# Patient Record
Sex: Female | Born: 1945 | Race: Black or African American | Hispanic: No | Smoking: Never smoker
Health system: Southern US, Community
[De-identification: ages and names within clinical notes are randomized; demographics above are authoritative.]

## PROBLEM LIST (undated history)

## (undated) DIAGNOSIS — I10 Essential (primary) hypertension: Secondary | ICD-10-CM

## (undated) DIAGNOSIS — I2699 Other pulmonary embolism without acute cor pulmonale: Secondary | ICD-10-CM

## (undated) DIAGNOSIS — R160 Hepatomegaly, not elsewhere classified: Secondary | ICD-10-CM

## (undated) DIAGNOSIS — I639 Cerebral infarction, unspecified: Secondary | ICD-10-CM

## (undated) HISTORY — PX: ABDOMINAL HYSTERECTOMY: SHX81

---

## 2005-05-15 ENCOUNTER — Emergency Department (HOSPITAL_COMMUNITY): Admission: EM | Admit: 2005-05-15 | Discharge: 2005-05-15 | Payer: Self-pay | Admitting: Emergency Medicine

## 2016-03-08 ENCOUNTER — Emergency Department (INDEPENDENT_AMBULATORY_CARE_PROVIDER_SITE_OTHER)
Admission: EM | Admit: 2016-03-08 | Discharge: 2016-03-08 | Disposition: A | Discharge status: Home / Self Care | Payer: Medicare Other | Source: Home / Self Care | Attending: Family Medicine | Admitting: Family Medicine

## 2016-03-08 ENCOUNTER — Encounter (HOSPITAL_COMMUNITY): Payer: Self-pay | Admitting: Emergency Medicine

## 2016-03-08 DIAGNOSIS — J40 Bronchitis, not specified as acute or chronic: Secondary | ICD-10-CM | POA: Diagnosis not present

## 2016-03-08 MED ORDER — DOXYCYCLINE HYCLATE 100 MG PO CAPS: 100.0000 mg | ORAL_CAPSULE | Freq: Two times a day (BID) | ORAL | Status: DC

## 2016-03-08 MED ORDER — BENZONATATE 100 MG PO CAPS: 100.0000 mg | ORAL_CAPSULE | Freq: Three times a day (TID) | ORAL | Status: DC

## 2016-03-08 NOTE — ED Provider Notes (Signed)
CSN: [id number]     Arrival date & time 03/08/16  1410 History   First MD Initiated Contact with Patient 03/08/16 1532     Chief Complaint  Patient presents with  . URI   (Consider location/radiation/quality/duration/timing/severity/associated sxs/prior Treatment) HPI [name] is a 70 year old African American female who presents with chief complaint of cough. On March 6 she developed a cough, fever, and "head congestion," for which she took Coricidin and juice. The fever broke. However, she has continued to have a wet cough, though she is unable to produce mucus, and coughing causes midsternal chest pain. The cough keeps her up at night. She reports rhinorrhea, sore throat, wheezing at night. She denies any further fever/chills and chest pain. She finished the Coricidin 3-4 days ago.  She has a past medical history of stroke (with residual balance issues and occasional trouble swallowing), MI, HTN, Hep C (treated with Harvoni) and cirrhosis.  She is accompanied by her daughter. They are hoping for a better cough medicine for her and also want to prevent pneumonia.  History reviewed. No pertinent past medical history. History reviewed. No pertinent past surgical history. No family history on file. Social History  Substance Use Topics  . Smoking status: Never Smoker   . Smokeless tobacco: None  . Alcohol Use: No   OB History    No data available     Review of Systems See HPI.  Allergies  Review of patient's allergies indicates no known allergies.  Home Medications   Prior to Admission medications   Medication Sig Start Date End Date Taking? Authorizing Provider  aspirin 81 MG tablet Take 81 mg by mouth daily.   Yes Historical Provider, MD  furosemide (LASIX) 40 MG tablet Take 40 mg by mouth.   Yes Historical Provider, MD  nadolol (CORGARD) 40 MG tablet Take 40 mg by mouth daily.   Yes Historical Provider, MD  sertraline (ZOLOFT) 100 MG tablet Take 100 mg by mouth daily.   Yes  Historical Provider, MD  spironolactone (ALDACTONE) 25 MG tablet Take 25 mg by mouth daily.   Yes Historical Provider, MD  benzonatate (TESSALON) 100 MG capsule Take 1 capsule (100 mg total) by mouth every 8 (eight) hours. 03/08/16   Konrad Felix, PA  doxycycline (VIBRAMYCIN) 100 MG capsule Take 1 capsule (100 mg total) by mouth 2 (two) times daily. 03/08/16   Konrad Felix, PA   Meds Ordered and Administered this Visit  Medications - No data to display  BP 152/86 mmHg  Pulse 66  Temp(Src) 98.6 F (37 C) (Oral)  Resp 16  SpO2 96% No data found.   Physical Exam General: Pleasant female, overweight, no acute distress, seated in wheelchair Eyes: PERRL, EOM's intact, cataracts bilaterally ENT: TMs exhibit scarring; nasal turbinates erythematous; pharynx mildly erythematous with no exudates Neck: No cervical lymphadenopathy CV: RRR, no murmurs/rubs/gallops appreciated Lungs: CTAB, no wheezes/rales/rhonchi Abd: Soft, nondistended, nontender, hypoactive BS Psych: Normal mood and affect  ED Course  Procedures (including critical care time)  Labs Review Labs Reviewed - No data to display  Imaging Review No results found.   Visual Acuity Review  Right Eye Distance:   Left Eye Distance:   Bilateral Distance:    Right Eye Near:   Left Eye Near:    Bilateral Near:       We discussed physical findings and no need for chest xray today; the patient and daughter agreed.  MDM   1. Bronchitis    Given her  age and co-morbidities, especially PMH stroke and trouble swallowing, we will give a course of doxycycline to prevent development to pneumonia. Will give Tessalon perles for her cough to help her sleep at night.    Konrad Felix, [place] 03/08/16 1857

## 2016-03-08 NOTE — Discharge Instructions (Signed)
Upper Respiratory Infection, Adult Most upper respiratory infections (URIs) are a viral infection of the air passages leading to the lungs. A URI affects the nose, throat, and upper air passages. The most common type of URI is nasopharyngitis and is typically referred to as "the common cold." URIs run their course and usually go away on their own. Most of the time, a URI does not require medical attention, but sometimes a bacterial infection in the upper airways can follow a viral infection. This is called a secondary infection. Sinus and middle ear infections are common types of secondary upper respiratory infections. Bacterial pneumonia can also complicate a URI. A URI can worsen asthma and chronic obstructive pulmonary disease (COPD). Sometimes, these complications can require emergency medical care and may be life threatening.  CAUSES Almost all URIs are caused by viruses. A virus is a type of germ and can spread from one person to another.  RISKS FACTORS You may be at risk for a URI if:   You smoke.   You have chronic heart or lung disease.  You have a weakened defense (immune) system.   You are very young or very old.   You have nasal allergies or asthma.  You work in crowded or poorly ventilated areas.  You work in health care facilities or schools. SIGNS AND SYMPTOMS  Symptoms typically develop 2-3 days after you come in contact with a cold virus. Most viral URIs last 7-10 days. However, viral URIs from the influenza virus (flu virus) can last 14-18 days and are typically more severe. Symptoms may include:   Runny or stuffy (congested) nose.   Sneezing.   Cough.   Sore throat.   Headache.   Fatigue.   Fever.   Loss of appetite.   Pain in your forehead, behind your eyes, and over your cheekbones (sinus pain).  Muscle aches.  DIAGNOSIS  Your health care provider may diagnose a URI by:  Physical exam.  Tests to check that your symptoms are not due to  another condition such as:  Strep throat.  Sinusitis.  Pneumonia.  Asthma. TREATMENT  A URI goes away on its own with time. It cannot be cured with medicines, but medicines may be prescribed or recommended to relieve symptoms. Medicines may help:  Reduce your fever.  Reduce your cough.  Relieve nasal congestion. HOME CARE INSTRUCTIONS   Take medicines only as directed by your health care provider.   Gargle warm saltwater or take cough drops to comfort your throat as directed by your health care provider.  Use a warm mist humidifier or inhale steam from a shower to increase air moisture. This may make it easier to breathe.  Drink enough fluid to keep your urine clear or pale yellow.   Eat soups and other clear broths and maintain good nutrition.   Rest as needed.   Return to work when your temperature has returned to normal or as your health care provider advises. You may need to stay home longer to avoid infecting others. You can also use a face mask and careful hand washing to prevent spread of the virus.  Increase the usage of your inhaler if you have asthma.   Do not use any tobacco products, including cigarettes, chewing tobacco, or electronic cigarettes. If you need help quitting, ask your health care provider. PREVENTION  The best way to protect yourself from getting a cold is to practice good hygiene.   Avoid oral or hand contact with people with cold  symptoms.   Wash your hands often if contact occurs.  There is no clear evidence that vitamin C, vitamin E, echinacea, or exercise reduces the chance of developing a cold. However, it is always recommended to get plenty of rest, exercise, and practice good nutrition.  SEEK MEDICAL CARE IF:   You are getting worse rather than better.   Your symptoms are not controlled by medicine.   You have chills.  You have worsening shortness of breath.  You have brown or red mucus.  You have yellow or brown nasal  discharge.  You have pain in your face, especially when you bend forward.  You have a fever.  You have swollen neck glands.  You have pain while swallowing.  You have white areas in the back of your throat. SEEK IMMEDIATE MEDICAL CARE IF:   You have severe or persistent:  Headache.  Ear pain.  Sinus pain.  Chest pain.  You have chronic lung disease and any of the following:  Wheezing.  Prolonged cough.  Coughing up blood.  A change in your usual mucus.  You have a stiff neck.  You have changes in your:  Vision.  Hearing.  Thinking.  Mood. MAKE SURE YOU:   Understand these instructions.  Will watch your condition.  Will get help right away if you are not doing well or get worse.   This information is not intended to replace advice given to you by your health care provider. Make sure you discuss any questions you have with your health care provider.   Document Released: 06/04/2001 Document Revised: 04/25/2015 Document Reviewed: 03/16/2014 Elsevier Interactive Patient Education 2016 Else[employer] Acute Bronchitis Bronchitis is inflammation of the airways that extend from the windpipe into the lungs (bronchi). The inflammation often causes mucus to develop. This leads to a cough, which is the most common symptom of bronchitis.  In acute bronchitis, the condition usually develops suddenly and goes away over time, usually in a couple weeks. Smoking, allergies, and asthma can make bronchitis worse. Repeated episodes of bronchitis may cause further lung problems.  CAUSES Acute bronchitis is most often caused by the same virus that causes a cold. The virus can spread from person to person (contagious) through coughing, sneezing, and touching contaminated objects. SIGNS AND SYMPTOMS   Cough.   Fever.   Coughing up mucus.   Body aches.   Chest congestion.   Chills.   Shortness of breath.   Sore throat.  DIAGNOSIS  Acute bronchitis is  usually diagnosed through a physical exam. Your health care provider will also ask you questions about your medical history. Tests, such as chest X-rays, are sometimes done to rule out other conditions.  TREATMENT  Acute bronchitis usually goes away in a couple weeks. Oftentimes, no medical treatment is necessary. Medicines are sometimes given for relief of fever or cough. Antibiotic medicines are usually not needed but may be prescribed in certain situations. In some cases, an inhaler may be recommended to help reduce shortness of breath and control the cough. A cool mist vaporizer may also be used to help thin bronchial secretions and make it easier to clear the chest.  HOME CARE INSTRUCTIONS  Get plenty of rest.   Drink enough fluids to keep your urine clear or pale yellow (unless you have a medical condition that requires fluid restriction). Increasing fluids may help thin your respiratory secretions (sputum) and reduce chest congestion, and it will prevent dehydration.   Take medicines only as directed by  your health care provider.  If you were prescribed an antibiotic medicine, finish it all even if you start to feel better.  Avoid smoking and secondhand smoke. Exposure to cigarette smoke or irritating chemicals will make bronchitis worse. If you are a smoker, consider using nicotine gum or skin patches to help control withdrawal symptoms. Quitting smoking will help your lungs heal faster.   Reduce the chances of another bout of acute bronchitis by washing your hands frequently, avoiding people with cold symptoms, and trying not to touch your hands to your mouth, nose, or eyes.   Keep all follow-up visits as directed by your health care provider.  SEEK MEDICAL CARE IF: Your symptoms do not improve after 1 week of treatment.  SEEK IMMEDIATE MEDICAL CARE IF:  You develop an increased fever or chills.   You have chest pain.   You have severe shortness of breath.  You have bloody  sputum.   You develop dehydration.  You faint or repeatedly feel like you are going to pass out.  You develop repeated vomiting.  You develop a severe headache. MAKE SURE YOU:   Understand these instructions.  Will watch your condition.  Will get help right away if you are not doing well or get worse.   This information is not intended to replace advice given to you by your health care provider. Make sure you discuss any questions you have with your health care provider.   Document Released: 01/16/2005 Document Revised: 12/30/2014 Document Reviewed: 06/01/2013 Elsevier Interactive Patient Education [employer].

## 2016-03-08 NOTE — ED Notes (Signed)
C/o persistent prod cough onset 3/6 associated w/runny nose, congestion, PND Denies fevers A&O X4... No acute distress.

## 2017-07-27 ENCOUNTER — Emergency Department (HOSPITAL_COMMUNITY)
Admission: EM | Admit: 2017-07-27 | Discharge: 2017-07-27 | Disposition: A | Discharge status: Home / Self Care | Payer: Medicare Other | Attending: Emergency Medicine | Admitting: Emergency Medicine

## 2017-07-27 ENCOUNTER — Encounter (HOSPITAL_COMMUNITY): Payer: Self-pay | Admitting: Emergency Medicine

## 2017-07-27 ENCOUNTER — Emergency Department (HOSPITAL_COMMUNITY): Payer: Medicare Other

## 2017-07-27 DIAGNOSIS — Z79899 Other long term (current) drug therapy: Secondary | ICD-10-CM | POA: Insufficient documentation

## 2017-07-27 DIAGNOSIS — M25551 Pain in right hip: Secondary | ICD-10-CM | POA: Diagnosis present

## 2017-07-27 DIAGNOSIS — Z7982 Long term (current) use of aspirin: Secondary | ICD-10-CM | POA: Diagnosis not present

## 2017-07-27 LAB — I-STAT CHEM 8, ED
BUN: 21 mg/dL — AB (ref 6–20)
CHLORIDE: 106 mmol/L (ref 101–111)
Calcium, Ion: 1.12 mmol/L — ABNORMAL LOW (ref 1.15–1.40)
Creatinine, Ser: 0.9 mg/dL (ref 0.44–1.00)
Glucose, Bld: 96 mg/dL (ref 65–99)
HEMATOCRIT: 38 % (ref 36.0–46.0)
Hemoglobin: 12.9 g/dL (ref 12.0–15.0)
Potassium: 3.5 mmol/L (ref 3.5–5.1)
SODIUM: 146 mmol/L — AB (ref 135–145)
TCO2: 29 mmol/L (ref 0–100)

## 2017-07-27 MED ORDER — CYCLOBENZAPRINE HCL 5 MG PO TABS: 5.0000 mg | ORAL_TABLET | Freq: Three times a day (TID) | ORAL | 0 refills | Status: DC | PRN

## 2017-07-27 MED ORDER — PREDNISONE 20 MG PO TABS: ORAL_TABLET | ORAL | 0 refills | Status: DC

## 2017-07-27 MED ORDER — NAPROXEN 250 MG PO TABS: ORAL_TABLET | ORAL | 0 refills | Status: DC

## 2017-07-27 MED ORDER — NAPROXEN 500 MG PO TABS
250.0000 mg | ORAL_TABLET | Freq: Once | ORAL | Status: AC
  Administered 2017-07-27: 250 mg via ORAL
  Filled 2017-07-27: qty 1

## 2017-07-27 MED ORDER — DEXAMETHASONE SODIUM PHOSPHATE 4 MG/ML IJ SOLN: 10.0000 mg | Freq: Once | INTRAMUSCULAR | Status: DC

## 2017-07-27 MED ORDER — METHOCARBAMOL 500 MG PO TABS
1000.0000 mg | ORAL_TABLET | Freq: Three times a day (TID) | ORAL | Status: DC
  Administered 2017-07-27: 1000 mg via ORAL
  Filled 2017-07-27: qty 2

## 2017-07-27 NOTE — Discharge Instructions (Signed)
Use ice and heat for comfort. Take the medications as prescribed. Follow up with your doctor, or Dr Marcelino Scot, the orthopedist on call, if you aren't improving over the next week.

## 2017-07-27 NOTE — ED Triage Notes (Signed)
Brought in by EMS from home with c/o right hip pain, onset while she was getting into her car.  Pt denies trauma or injury---- denies fall or twisting leg.

## 2017-07-27 NOTE — ED Notes (Signed)
Bed: NZ83 Expected date:  Expected time:  Means of arrival:  Comments: HIP INJURY

## 2017-07-27 NOTE — ED Provider Notes (Signed)
[facility] DEPT Provider Note   CSN: [id number] Arrival date & time: 07/27/17  0140  Time seen 2:30 AM   History   Chief Complaint Chief Complaint  Patient presents with  . Hip Pain    HPI Audrey Weber is a 71 y.o. female.  HPI   patient states about 7 PM she had been riding in her daughter's car and when she started to get out of the car she had acute onset of pain in her right posterior hip. She denies any back pain. She denies any trauma or hearing a pop. She denies numbness in legs. He states she's never had this happen before. She states her family had to go get her walker so she did walk into the house. She states she is normally weak on her right side since she had a stroke 8 years ago. She has taken no medications for this pain tonight. She states movement makes the pain feel worse.  PCP [facility] hospital   History reviewed. No pertinent past medical history.  There are no active problems to display for this patient.   History reviewed. No pertinent surgical history.  OB History    No data available       Home Medications    Prior to Admission medications   Medication Sig Start Date End Date Taking? Authorizing Provider  aspirin EC 81 MG tablet Take 81 mg by mouth daily.   Yes [provider]  furosemide (LASIX) 40 MG tablet Take 40 mg by mouth daily.    Yes [provider]  magnesium oxide (MAGNESIUM-OXIDE) 400 (241.3 Mg) MG tablet Take 400 mg by mouth daily.   Yes [provider]  nadolol (CORGARD) 40 MG tablet Take 40 mg by mouth daily.   Yes [provider]  sertraline (ZOLOFT) 25 MG tablet Take 25 mg by mouth daily.   Yes [provider]  simvastatin (ZOCOR) 20 MG tablet Take 20 mg by mouth at bedtime.   Yes [provider]  spironolactone (ALDACTONE) 25 MG tablet Take 25 mg by mouth daily.   Yes [provider]  cyclobenzaprine (FLEXERIL) 5 MG tablet Take 1 tablet (5 mg total) by mouth 3  (three) times daily as needed for muscle spasms. 07/27/17   Rolland Porter, MD  naproxen (NAPROSYN) 250 MG tablet Take 1 po BID with food prn pain 07/27/17   Rolland Porter, MD  predniSONE (DELTASONE) 20 MG tablet Take 3 po QD x 3d , then 2 po QD x 3d then 1 po QD x 3d 07/27/17   Rolland Porter, MD    Family History History reviewed. No pertinent family history.  Social History Social History  Substance Use Topics  . Smoking status: Never Smoker  . Smokeless tobacco: Never Used  . Alcohol use No  uses a walker   Allergies   Patient has no known allergies.   Review of Systems Review of Systems  All other systems reviewed and are negative.    Physical Exam Updated Vital Signs BP (!) 153/98 (BP Location: Left Arm)   Pulse 89   Temp 98.6 F (37 C) (Oral)   Resp 16   SpO2 99%   Vital signs normal except for hypertension   Physical Exam  Constitutional: She is oriented to person, place, and time. She appears well-developed and well-nourished.  Non-toxic appearance. She does not appear ill. No distress.  HENT:  Head: Normocephalic and atraumatic.  Right Ear: External ear normal.  Left Ear: External ear  normal.  Nose: Nose normal. No mucosal edema or rhinorrhea.  Mouth/Throat: Oropharynx is clear and moist and mucous membranes are normal. No dental abscesses or uvula swelling.  Eyes: Pupils are equal, round, and reactive to light. Conjunctivae and EOM are normal.  Neck: Normal range of motion and full passive range of motion without pain. Neck supple.  Cardiovascular: Normal rate, regular rhythm and normal heart sounds.  Exam reveals no gallop and no friction rub.   No murmur heard. Pulmonary/Chest: Effort normal and breath sounds normal. No respiratory distress. She has no wheezes. She has no rhonchi. She has no rales. She exhibits no tenderness and no crepitus.  Abdominal: Soft. Normal appearance and bowel sounds are normal. She exhibits no distension. There is no tenderness. There is no  rebound and no guarding.  Musculoskeletal: Normal range of motion. She exhibits no edema or tenderness.  Moves all extremities well. Nontender in her lumbar spine. Patient is tender along her posterior and lateral  greater trochanter in her right SI joint that reproduces her complaints of pain.  Neurological: She is alert and oriented to person, place, and time. She has normal strength. No cranial nerve deficit.  Skin: Skin is warm, dry and intact. No rash noted. No erythema. No pallor.  Psychiatric: She has a normal mood and affect. Her speech is normal and behavior is normal. Her mood appears not anxious.  Nursing note and vitals reviewed.    ED Treatments / Results  Labs (all labs ordered are listed, but only abnormal results are displayed) Results for orders placed or performed during the hospital encounter of 07/27/17  I-stat Chem 8, ED  Result Value Ref Range   Sodium 146 (H) 135 - 145 mmol/L   Potassium 3.5 3.5 - 5.1 mmol/L   Chloride 106 101 - 111 mmol/L   BUN 21 (H) 6 - 20 mg/dL   Creatinine, Ser 0.90 0.44 - 1.00 mg/dL   Glucose, Bld 96 65 - 99 mg/dL   Calcium, Ion 1.12 (L) 1.15 - 1.40 mmol/L   TCO2 29 0 - 100 mmol/L   Hemoglobin 12.9 12.0 - 15.0 g/dL   HCT 38.0 36.0 - 46.0 %   Laboratory interpretation all normal     EKG  EKG Interpretation None       Radiology Dg Lumbar Spine Complete  Result Date: 07/27/2017 CLINICAL DATA:  Acute onset of right hip pain, extending along the right sacroiliac joint. Initial encounter. EXAM: LUMBAR SPINE - COMPLETE 4+ VIEW COMPARISON:  None. FINDINGS: There is no evidence of fracture or subluxation. Vertebral bodies demonstrate normal height and alignment. Intervertebral disc spaces are preserved. The visualized neural foramina are grossly unremarkable in appearance. The visualized bowel gas pattern is unremarkable in appearance; air and stool are noted within the colon. The sacroiliac joints are within normal limits. Scattered  calcification is seen along the abdominal aorta. IMPRESSION: 1. No evidence of fracture or subluxation along the lumbar spine. 2. Scattered aortic atherosclerosis. Electronically Signed   By: Garald Balding M.D.   On: 07/27/2017 03:20   Dg Hip Unilat W Or Wo Pelvis 2-3 Views Right  Result Date: 07/27/2017 CLINICAL DATA:  Acute onset of right hip pain.  Initial encounter. EXAM: DG HIP (WITH OR WITHOUT PELVIS) 2-3V RIGHT COMPARISON:  None. FINDINGS: There is no evidence of fracture or dislocation. Both femoral heads are seated normally within their respective acetabula. The proximal right femur appears intact. No significant degenerative change is appreciated. The sacroiliac joints are unremarkable in  appearance. The visualized bowel gas pattern is grossly unremarkable in appearance. Scattered phleboliths are noted within the pelvis. IMPRESSION: No evidence of fracture or dislocation. Electronically Signed   By: Garald Balding M.D.   On: 07/27/2017 03:19    Procedures Procedures (including critical care time)  Medications Ordered in ED Medications  methocarbamol (ROBAXIN) tablet 1,000 mg (1,000 mg Oral Given 07/27/17 0301)  dexamethasone (DECADRON) injection 10 mg (not administered)  naproxen (NAPROSYN) tablet 250 mg (250 mg Oral Given 07/27/17 0300)     Initial Impression / Assessment and Plan / ED Course  I have reviewed the triage vital signs and the nursing notes.  Pertinent labs & imaging results that were available during my care of the patient were reviewed by me and considered in my medical decision making (see chart for details).    X-rays were obtained of her right hip and back. She was given naproxen and Robaxin. Patient appears to be over the greater trochanter or over the right SI joint. She has good range of motion of the hip so I think fracture is less likely. X-ray was done to look for underlying arthritis. With the radiation of her pain into her leg she had lumbar spine films  done   Recheck at 4:45 AM she states her pain has not improved. She was given Decadron for pain.  Final Clinical Impressions(s) / ED Diagnoses   Final diagnoses:  Right hip pain    New Prescriptions New Prescriptions   CYCLOBENZAPRINE (FLEXERIL) 5 MG TABLET    Take 1 tablet (5 mg total) by mouth 3 (three) times daily as needed for muscle spasms.   NAPROXEN (NAPROSYN) 250 MG TABLET    Take 1 po BID with food prn pain   PREDNISONE (DELTASONE) 20 MG TABLET    Take 3 po QD x 3d , then 2 po QD x 3d then 1 po QD x 3d    Plan discharge  Rolland Porter, MD, Barbette Or, MD 07/27/17 [phone]

## 2018-08-12 ENCOUNTER — Observation Stay (HOSPITAL_COMMUNITY)
Admission: EM | Admit: 2018-08-12 | Discharge: 2018-08-13 | Disposition: A | Discharge status: Home / Self Care | Payer: Medicare Other | Attending: Family Medicine | Admitting: Family Medicine

## 2018-08-12 ENCOUNTER — Encounter (HOSPITAL_COMMUNITY): Payer: Self-pay

## 2018-08-12 ENCOUNTER — Emergency Department (HOSPITAL_COMMUNITY): Payer: Medicare Other

## 2018-08-12 ENCOUNTER — Other Ambulatory Visit: Payer: Self-pay

## 2018-08-12 DIAGNOSIS — Z8673 Personal history of transient ischemic attack (TIA), and cerebral infarction without residual deficits: Secondary | ICD-10-CM | POA: Insufficient documentation

## 2018-08-12 DIAGNOSIS — D72829 Elevated white blood cell count, unspecified: Secondary | ICD-10-CM | POA: Insufficient documentation

## 2018-08-12 DIAGNOSIS — I252 Old myocardial infarction: Secondary | ICD-10-CM | POA: Diagnosis not present

## 2018-08-12 DIAGNOSIS — K7460 Unspecified cirrhosis of liver: Secondary | ICD-10-CM | POA: Insufficient documentation

## 2018-08-12 DIAGNOSIS — F319 Bipolar disorder, unspecified: Secondary | ICD-10-CM | POA: Diagnosis not present

## 2018-08-12 DIAGNOSIS — R079 Chest pain, unspecified: Principal | ICD-10-CM | POA: Diagnosis present

## 2018-08-12 DIAGNOSIS — E785 Hyperlipidemia, unspecified: Secondary | ICD-10-CM | POA: Diagnosis not present

## 2018-08-12 DIAGNOSIS — Z6841 Body Mass Index (BMI) 40.0 and over, adult: Secondary | ICD-10-CM | POA: Diagnosis not present

## 2018-08-12 DIAGNOSIS — I2510 Atherosclerotic heart disease of native coronary artery without angina pectoris: Secondary | ICD-10-CM | POA: Insufficient documentation

## 2018-08-12 DIAGNOSIS — Z7982 Long term (current) use of aspirin: Secondary | ICD-10-CM | POA: Insufficient documentation

## 2018-08-12 DIAGNOSIS — I739 Peripheral vascular disease, unspecified: Secondary | ICD-10-CM | POA: Insufficient documentation

## 2018-08-12 DIAGNOSIS — I10 Essential (primary) hypertension: Secondary | ICD-10-CM | POA: Diagnosis not present

## 2018-08-12 HISTORY — DX: Essential (primary) hypertension: I10

## 2018-08-12 LAB — BRAIN NATRIURETIC PEPTIDE: B NATRIURETIC PEPTIDE 5: 33.7 pg/mL (ref 0.0–100.0)

## 2018-08-12 LAB — CBC
HCT: 39.7 % (ref 36.0–46.0)
HEMATOCRIT: 40.7 % (ref 36.0–46.0)
HEMOGLOBIN: 12.6 g/dL (ref 12.0–15.0)
Hemoglobin: 12.7 g/dL (ref 12.0–15.0)
MCH: 28.9 pg (ref 26.0–34.0)
MCH: 29.5 pg (ref 26.0–34.0)
MCHC: 31.2 g/dL (ref 30.0–36.0)
MCHC: 31.7 g/dL (ref 30.0–36.0)
MCV: 92.5 fL (ref 78.0–100.0)
MCV: 93 fL (ref 78.0–100.0)
PLATELETS: 139 10*3/uL — AB (ref 150–400)
Platelets: 121 10*3/uL — ABNORMAL LOW (ref 150–400)
RBC: 4.4 MIL/uL (ref 3.87–5.11)
RBC: 4.27 MIL/uL (ref 3.87–5.11)
RDW: 14.6 % (ref 11.5–15.5)
RDW: 14.7 % (ref 11.5–15.5)
WBC: 10.7 10*3/uL — AB (ref 4.0–10.5)
WBC: 10 10*3/uL (ref 4.0–10.5)

## 2018-08-12 LAB — I-STAT TROPONIN, ED
TROPONIN I, POC: 0.01 ng/mL (ref 0.00–0.08)
TROPONIN I, POC: 0.01 ng/mL (ref 0.00–0.08)
Troponin i, poc: 0 ng/mL (ref 0.00–0.08)
Troponin i, poc: 0 ng/mL (ref 0.00–0.08)

## 2018-08-12 LAB — CREATININE, SERUM
CREATININE: 0.96 mg/dL (ref 0.44–1.00)
GFR calc Af Amer: >60 mL/min (ref >60)
GFR calc non Af Amer: 57 mL/min — ABNORMAL LOW (ref >60)

## 2018-08-12 LAB — BASIC METABOLIC PANEL
Anion gap: 10 (ref 5–15)
BUN: 12 mg/dL (ref 8–23)
CHLORIDE: 100 mmol/L (ref 98–111)
CO2: 31 mmol/L (ref 22–32)
Calcium: 9.2 mg/dL (ref 8.9–10.3)
Creatinine, Ser: 0.9 mg/dL (ref 0.44–1.00)
GFR calc non Af Amer: >60 mL/min (ref >60)
Glucose, Bld: 90 mg/dL (ref 70–99)
POTASSIUM: 4.1 mmol/L (ref 3.5–5.1)
SODIUM: 141 mmol/L (ref 135–145)

## 2018-08-12 MED ORDER — HEPARIN SODIUM (PORCINE) 5000 UNIT/ML IJ SOLN
5000.0000 [IU] | Freq: Three times a day (TID) | INTRAMUSCULAR | Status: DC
  Administered 2018-08-12 – 2018-08-13 (×2): 5000 [IU] via SUBCUTANEOUS
  Filled 2018-08-12 (×2): qty 1

## 2018-08-12 MED ORDER — ONDANSETRON HCL 4 MG/2ML IJ SOLN: 4.0000 mg | Freq: Four times a day (QID) | INTRAMUSCULAR | Status: DC | PRN

## 2018-08-12 MED ORDER — SERTRALINE HCL 25 MG PO TABS
25.0000 mg | ORAL_TABLET | Freq: Every day | ORAL | Status: DC
  Administered 2018-08-13: 25 mg via ORAL
  Filled 2018-08-12 (×2): qty 1

## 2018-08-12 MED ORDER — GI COCKTAIL ~~LOC~~
30.0000 mL | Freq: Once | ORAL | Status: AC
  Administered 2018-08-12: 30 mL via ORAL
  Filled 2018-08-12: qty 30

## 2018-08-12 MED ORDER — FUROSEMIDE 20 MG PO TABS
40.0000 mg | ORAL_TABLET | Freq: Every day | ORAL | Status: DC
  Administered 2018-08-13: 40 mg via ORAL
  Filled 2018-08-12: qty 2

## 2018-08-12 MED ORDER — NADOLOL 40 MG PO TABS
40.0000 mg | ORAL_TABLET | Freq: Every day | ORAL | Status: DC
  Administered 2018-08-13: 40 mg via ORAL
  Filled 2018-08-12 (×2): qty 1

## 2018-08-12 MED ORDER — ACETAMINOPHEN 325 MG PO TABS: 650.0000 mg | ORAL_TABLET | ORAL | Status: DC | PRN

## 2018-08-12 MED ORDER — ASPIRIN EC 81 MG PO TBEC
81.0000 mg | DELAYED_RELEASE_TABLET | Freq: Every day | ORAL | Status: DC
  Administered 2018-08-13: 81 mg via ORAL
  Filled 2018-08-12: qty 1

## 2018-08-12 MED ORDER — SIMVASTATIN 20 MG PO TABS
20.0000 mg | ORAL_TABLET | Freq: Every day | ORAL | Status: DC
  Filled 2018-08-12: qty 1

## 2018-08-12 MED ORDER — SPIRONOLACTONE 25 MG PO TABS
25.0000 mg | ORAL_TABLET | Freq: Every day | ORAL | Status: DC
  Administered 2018-08-13: 25 mg via ORAL
  Filled 2018-08-12 (×2): qty 1

## 2018-08-12 NOTE — ED Provider Notes (Signed)
[facility] Provider Note  CSN: [id number] Arrival date & time: 08/12/18  1508  History   Chief Complaint Chief Complaint  Patient presents with  . Chest Pain   HPI  Audrey Weber is a 72 y.o. female with a medical history of HTN, cirrhosis, PVD, MI (x2) and stroke who presented to the ED for chest pain x30 minutes. The discomfort is described as midsternal aching pain with no radiation. Onset of symptoms was abrupt starting at ~ 2pm after waking up from a nap. She states that chest pain stopped after 30 minutes. Denies any other symptoms during that time including dizziness, SOB, cough, abdominal pain, N/V, vision changes, slurred speech, paresthesias. She was given 324mg  of aspirin. Patient arrived to the ED without chest pain. She reports having similar episodes of chest pain in the past preceding her past MI.     Past Medical History:  Diagnosis Date  . Hypertension     Patient Active Problem List   Diagnosis Date Noted  . Chest pain 08/12/2018    Past Surgical History:  Procedure Laterality Date  . ABDOMINAL HYSTERECTOMY       OB History   None      Home Medications    Prior to Admission medications   Medication Sig Start Date End Date Taking? Authorizing Provider  aspirin EC 81 MG tablet Take 81 mg by mouth daily.    [provider]  cyclobenzaprine (FLEXERIL) 5 MG tablet Take 1 tablet (5 mg total) by mouth 3 (three) times daily as needed for muscle spasms. 07/27/17   Rolland Porter, MD  furosemide (LASIX) 40 MG tablet Take 40 mg by mouth daily.     [provider]  magnesium oxide (MAGNESIUM-OXIDE) 400 (241.3 Mg) MG tablet Take 400 mg by mouth daily.    [provider]  nadolol (CORGARD) 40 MG tablet Take 40 mg by mouth daily.    [provider]  naproxen (NAPROSYN) 250 MG tablet Take 1 po BID with food prn pain 07/27/17   Rolland Porter, MD  predniSONE (DELTASONE) 20 MG tablet Take 3 po QD x 3d ,  then 2 po QD x 3d then 1 po QD x 3d 07/27/17   Rolland Porter, MD  sertraline (ZOLOFT) 25 MG tablet Take 25 mg by mouth daily.    [provider]  simvastatin (ZOCOR) 20 MG tablet Take 20 mg by mouth at bedtime.    [provider]  spironolactone (ALDACTONE) 25 MG tablet Take 25 mg by mouth daily.    [provider]    Family History No family history on file.  Social History Social History   Tobacco Use  . Smoking status: Never Smoker  . Smokeless tobacco: Never Used  Substance Use Topics  . Alcohol use: No  . Drug use: No     Allergies   Patient has no known allergies.   Review of Systems Review of Systems  Constitutional: Negative for chills, fatigue and fever.  Eyes: Negative for visual disturbance.  Respiratory: Negative for cough, chest tightness and shortness of breath.   Cardiovascular: Positive for chest pain. Negative for palpitations and leg swelling.  Gastrointestinal: Negative for abdominal pain, nausea and vomiting.  Genitourinary: Negative.   Musculoskeletal: Negative.   Skin: Negative.   Neurological: Negative.   Psychiatric/Behavioral: Negative.    Physical Exam Updated Vital Signs BP 134/72   Pulse 70   Temp 98.3 F (36.8 C) (Oral)   Resp 19  Ht 5' (1.524 m)   Wt 104.3 kg   SpO2 98%   BMI 44.92 kg/m   Physical Exam  Eyes: Pupils are equal, round, and reactive to light. EOM are normal.  Neck: Normal range of motion. Neck supple.  Cardiovascular: Normal rate, regular rhythm, intact distal pulses and normal pulses. PMI is not displaced.  No murmur heard. Pulmonary/Chest: Effort normal and breath sounds normal.  Not in respiratory distress. Rhonchi heard in lower lobes bilaterally. Normal breath sounds throughout remaining lung fields.   Abdominal: Soft. Bowel sounds are normal. She exhibits no distension.  Tenderness in LUQ near scar from previous abdominal surgery.  Musculoskeletal:       Right lower leg: Normal. She  exhibits no edema.       Left lower leg: Normal. She exhibits no edema.  Skin: Skin is warm. Capillary refill takes less than 2 seconds. She is not diaphoretic. No pallor.  Psychiatric: She has a normal mood and affect. Her behavior is normal.  Nursing note and vitals reviewed.  ED Treatments / Results  Labs (all labs ordered are listed, but only abnormal results are displayed) Labs Reviewed  CBC - Abnormal; Notable for the following components:      Result Value   WBC 10.7 (*)    Platelets 139 (*)    All other components within normal limits  BASIC METABOLIC PANEL  BRAIN NATRIURETIC PEPTIDE  CBC  CREATININE, SERUM  I-STAT TROPONIN, ED  I-STAT TROPONIN, ED    EKG EKG Interpretation  Date/Time:  Wednesday August 12 2018 15:51:25 EDT Ventricular Rate:  69 PR Interval:    QRS Duration: 105 QT Interval:  428 QTC Calculation: 459 R Axis:   -17 Text Interpretation:  Sinus rhythm Borderline prolonged PR interval Low voltage, precordial leads Probable left ventricular hypertrophy Borderline T abnormalities, inferior leads Confirmed by Milton Ferguson [phone]) on 08/12/2018 4:37:36 PM   Radiology Dg Chest 2 View  Result Date: 08/12/2018 CLINICAL DATA:  Chest pain a few hours ago. No pain is noted at this time. EXAM: CHEST - 2 VIEW COMPARISON:  None. FINDINGS: The heart size and mediastinal contours are within normal limits. Both lungs are clear. The visualized skeletal structures are unremarkable. Mediastinum is mildly prominent secondary to patient rotation to the right. IMPRESSION: No active cardiopulmonary disease. Electronically Signed   By: Inez Catalina M.D.   On: 08/12/2018 17:00    Procedures Procedures (including critical care time)  Medications Ordered in ED Medications  aspirin EC tablet 81 mg (has no administration in time range)  furosemide (LASIX) tablet 40 mg (has no administration in time range)  nadolol (CORGARD) tablet 40 mg (has no administration in time range)    simvastatin (ZOCOR) tablet 20 mg (has no administration in time range)  spironolactone (ALDACTONE) tablet 25 mg (has no administration in time range)  sertraline (ZOLOFT) tablet 25 mg (has no administration in time range)  acetaminophen (TYLENOL) tablet 650 mg (has no administration in time range)  ondansetron (ZOFRAN) injection 4 mg (has no administration in time range)  heparin injection 5,000 Units (has no administration in time range)  gi cocktail (Maalox,Lidocaine,Donnatal) (30 mLs Oral Given 08/12/18 1713)     Initial Impression / Assessment and Plan / ED Course  Triage vital signs and the nursing notes have been reviewed.  Pertinent labs & imaging results that were available during care of the patient were reviewed and considered in medical decision making (see chart for details).  Patient presents mildly hypertensive,  but currently does not have any chest pain or other physical complaints. She describes a 30 minute episode of midsternal chest pain with no accompanying symptoms that was relieved with rest and 324mg  of aspirin. No other medications ordered in the ED as patient is currently asymptomatic. HEART score of 5 which places her at increased risk of acute cardiac event. Patient has history of prior myocardial infarcts (~1980s) as seen in [facility] and endorsed by patient's family. Will proceed with appropriate chest pain work-up.  Clinical Course as of Aug 12 1746  Wed Aug 12, 2018  1542 Negative troponin which is reassuring.   [GM]  1600 EKG showed NSR. No ST elevations/depressions or signs of acute ischemia or infarct. Borderline prolonged PR interval. No past EKGs to compare to. Overall reassuring EKG especially in conjunction with negative troponin. Will order repeat troponin.   [GM]  1645 Case discussed with Dr. Milton Ferguson. Given pt's history of past MI and stroke, patient would benefit from at least overnight observation and chest pain work-up. Consult placed for  observation.   [GM]  1707 Case discussed with [facility] who also spoke with Dr. Milton Ferguson. [facility] will see the patient. Per [facility] request, GI cocktail was ordered despite patient not having any chest pain or discomfort.   [GM]  5701 CXR normal. No vascular congestion, consolidation or intra/extra-pleural abnormalities.   [GM]    Clinical Course User Index [GM] Colon Rueth, Jonelle Sports, PA-C    Final Clinical Impressions(s) / ED Diagnoses  1. Chest Pain. Case discussed with [facility] for observation.   Dispo: Observation.  Final diagnoses:  Chest pain, unspecified type    ED Discharge Orders    None        Junita Push 08/12/18 1747    Milton Ferguson, MD 08/12/18 2329

## 2018-08-12 NOTE — ED Notes (Signed)
Spoke to pharmacy tech to verify medications.

## 2018-08-12 NOTE — H&P (Signed)
History and Physical  Audrey Weber [id number] DOB: [birth year] DOA: 08/12/2018  PCP: Patient, No Pcp Per   Chief Complaint: CP  HPI:  89 yr African-American female, BMI 33, prior stroke 2007, reported history of MI but no documentation in care everywhere of ever having MIs-daughter states that this happened in 75?? 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, multiple other medical conditions comes into the hospital with chest pain  Was asleep this afternoon awoke with a start with central abdominal pain that seem to radiate upwards-no nausea, no vomiting, no fever, no chills, no DOE At baseline is poorly functional can only walk 15-20 steps and then gets winded, she also is not able to really participate much in past medical history and does not remember many details Has been living with her daughter after moving from [place] 3 years ago  To the ED was given 325 ASA with some relief of the pain  ED Course: In the emergency room was chest pain-free-has not yet received GI cocktail that I requested  Review of Systems:   Negative for fever, visual changes, sore throat, rash, new muscle aches, chest pain, SOB, dysuria, bleeding, n/v/abdominal pain.  Past Medical History:  Diagnosis Date  . Hypertension     Past Surgical History:  Procedure Laterality Date  . ABDOMINAL HYSTERECTOMY       reports that she has never smoked. She has never used smokeless tobacco. She reports that she does not drink alcohol or use drugs. Mobility: Without assistive device prior smoker   No Known Allergies  No family history on file. Extensive CAD CVAs in the past  Prior to Admission medications   Medication Sig Start Date End Date Taking? Authorizing Provider  aspirin EC 81 MG tablet Take 81 mg by mouth daily.    [provider]  cyclobenzaprine (FLEXERIL) 5 MG tablet Take 1 tablet (5 mg total) by mouth 3 (three) times daily as needed for muscle spasms. 07/27/17    Rolland Porter, MD  furosemide (LASIX) 40 MG tablet Take 40 mg by mouth daily.     [provider]  magnesium oxide (MAGNESIUM-OXIDE) 400 (241.3 Mg) MG tablet Take 400 mg by mouth daily.    [provider]  nadolol (CORGARD) 40 MG tablet Take 40 mg by mouth daily.    [provider]  naproxen (NAPROSYN) 250 MG tablet Take 1 po BID with food prn pain 07/27/17   Rolland Porter, MD  predniSONE (DELTASONE) 20 MG tablet Take 3 po QD x 3d , then 2 po QD x 3d then 1 po QD x 3d 07/27/17   Rolland Porter, MD  sertraline (ZOLOFT) 25 MG tablet Take 25 mg by mouth daily.    [provider]  simvastatin (ZOCOR) 20 MG tablet Take 20 mg by mouth at bedtime.    [provider]  spironolactone (ALDACTONE) 25 MG tablet Take 25 mg by mouth daily.    [provider]    Physical Exam:   Orbitally obese prior cataract surgeries noted no pallor no icterus Mallampati 4 never tested for OSA apparently  Mild JVD with mild hepatojugular reflex no carotids  Chest is clinically clear no added sound S1-S2 displaced PMI no murmur  Massively obese abdomen making examination difficult she has tenderness directly over the epigastrium and asked me to stop pressing on it no lower extremity edema  No CVA tenderness  Skin has no breakdown  Neurologically intact able to flex extend at arm and legs  and knees without deficit sensory grossly intact  I have personally reviewed following labs and imaging studies  Labs:   P is 33 troponin is 0.00 chest x-ray is negative white count is 10.7 platelets are 130s 9  Medical tests:   EKG independently reviewed: Sinus rhythm PR interval is 0.12 QRS axis is -20 appears to have an incomplete right bundle there is no prior EKG to compare does not seem to be an ischemic type pattern  Test discussed with performing physician:  n   Decision to obtain old records:   n   Review and summation of old records:   n   Active Problems:   * No  active hospital problems. *   Assessment/Plan Chest pain-cause unclear but probably could be related to epigastric tenderness and gastritis I will ask for GI cocktail to be done-because of the reported history of 2 MIs in the past it is reasonable to bring her in-she is already on a nonselective beta-blocker for her cirrhosis is not low which may need to be converted to a more selective beta-blocker such as metoprolol--at this time to think that this is MI and therefore we will hold on nitrates and heparin-we will follow troponins x3 and EKG in the morning and if negative she will need outpatient work-up--- that she is on Naprosyn which can sometimes cause worsening of gastritis  BMI >44-highest risk is her morbid obesity-she needs medical weight loss guidance  Hepatitis C status post Harvoni-no LFTs were done on admission we will repeat them-she can continue her Aldactone and Lasix at prior to admission doses she does not seem volume overloaded to me  Bipolar-continue Zoloft 25 daily  Mild leukocytosis-no overt fever-monitor in a.m.  Hyperlipidemia in a patient with chest pain we will get lipids and risk stratify      Severity of Illness: The appropriate patient status for this patient is OBSERVATION. Observation status is judged to be reasonable and necessary in order to provide the required intensity of service to ensure the patient's safety. The patient's presenting symptoms, physical exam findings, and initial radiographic and laboratory data in the context of their medical condition is felt to place them at decreased risk for further clinical deterioration. Furthermore, it is anticipated that the patient will be medically stable for discharge from the hospital within 2 midnights of admission. The following factors support the patient status of observation.   " The patient's presenting symptoms include chest pain. " The physical exam findings include epigastric tenderness and  discomfort. " The initial radiographic and laboratory data are reassuring and I think she can be discharged in the morning if work-up is negative.      Lovenox DNR observation discussed with daughter  Time spent: 9 minutes  Verneita Griffes, MD [facility] 5:31 PM   08/12/2018, 5:31 PM

## 2018-08-12 NOTE — ED Triage Notes (Signed)
Per [facility], pt coming from home where she experienced 30 min of CP from 1400 to 1430. Pt was pain free on EMS arrival. Pt received 324 ASA. Pt denies current pain, nausea, or SOB. EMS reports EKG unremarkable.

## 2018-08-13 DIAGNOSIS — R079 Chest pain, unspecified: Secondary | ICD-10-CM | POA: Diagnosis not present

## 2018-08-13 LAB — I-STAT TROPONIN, ED: TROPONIN I, POC: 0.01 ng/mL (ref 0.00–0.08)

## 2018-08-13 MED ORDER — ASPIRIN EC 81 MG PO TBEC: 81.0000 mg | DELAYED_RELEASE_TABLET | Freq: Every day | ORAL | 2 refills | Status: AC

## 2018-08-13 MED ORDER — PANTOPRAZOLE SODIUM 40 MG PO TBEC: 40.0000 mg | DELAYED_RELEASE_TABLET | Freq: Every day | ORAL | 11 refills | Status: DC

## 2018-08-13 NOTE — ED Notes (Signed)
Pt. Placed in hospital bed. Brief changed and purwick in place. Will continue to monitor.

## 2018-08-13 NOTE — Discharge Summary (Signed)
Physician Discharge Summary  Audrey Weber [id number] DOB: Nov 29, 1946 DOA: 08/12/2018  PCP: [facility] date: 08/12/2018 Discharge date: 08/13/2018  Time spent: 25 minutes  Recommendations for Outpatient Follow-up:  1. Suggest outpatient work-up for abdominal discomfort-did not have ACS this admission 2. Suggest outpatient stress testing as per PCP  Discharge Diagnoses:  Active Problems:   Chest pain   Discharge Condition: IMP  Diet recommendation: [facility]  Filed Weights   08/12/18 1511  Weight: 104.3 kg    History of present illness:  60 yr African-American female, BMI 69, prior stroke 2007, reported history of MI but no documentation in care everywhere of ever having MIs-daughter states that this happened in 63?? 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, multiple other medical conditions comes into the hospital with chest pain  Was asleep this afternoon awoke with a start with central abdominal pain that seem to radiate upwards-no nausea, no vomiting, no fever, no chills, no DOE At baseline is poorly functional can only walk 15-20 steps and then gets winded, she also is not able to really participate much in past medical history and does not remember many details Has been living with her daughter after moving from [place] 3 years ago  To the ED was given 325 ASA with some relief of the pain   Patient was admitted to the hospital given GI cocktail had no recurrence of her chest pain troponins were negative a.m. EKG my read showed left anterior fascicular block PR interval 0.12 with no ischemic injury and on pressure on her abdomen on discharge she still had some discomfort so I feel this is more gastrointestinal however I do feel that with her risks and reported issues with regards to prior MI which is unclear at this time as not documented she does not warrant an outpatient ACS work-up and by her primary     Discharge  Exam: Vitals:   08/13/18 1010 08/13/18 1011  BP: (!) 144/95 (!) 144/95  Pulse: 68 67  Resp:  16  Temp:    SpO2:  98%    General: Awake alert no distress tolerating diet No chest pain Eating drinking Wants to go home Cardiovascular: S1-S2 no murmur rub or gallop no reproducible pain in the chest Respiratory: Clinically clear no added sound Abdomen soft no rebound  Discharge Instructions   Discharge Instructions    Diet - low sodium heart healthy   Complete by:  As directed    Discharge instructions   Complete by:  As directed    You were found to have no significant evidence of heart attack on testing and may need an outpatient stress test however I think it is very reasonable that you go home and get this done-your signs and symptoms were more concerning for reflux and with the heart testing that we did overnight I feel it is reasonable that you take Protonix and get your primary care physician to schedule you for an outpatient stress test if you continue to have these issues, I would recommend you take aspirin in addition to your medications as this will be a good preventative against heart issues--take over-the-counter aspirin 81 mg   Increase activity slowly   Complete by:  As directed      Allergies as of 08/13/2018   No Known Allergies     Medication List    STOP taking these medications   UNKNOWN TO PATIENT     TAKE these medications   aspirin  EC 81 MG tablet Take 81 mg by mouth daily. What changed:  Another medication with the same name was added. Make sure you understand how and when to take each.   aspirin EC 81 MG tablet Take 1 tablet (81 mg total) by mouth daily. What changed:  You were already taking a medication with the same name, and this prescription was added. Make sure you understand how and when to take each.   furosemide 40 MG tablet Commonly known as:  LASIX Take 40 mg by mouth daily.   nadolol 40 MG tablet Commonly known as:  CORGARD Take  40 mg by mouth daily.   pantoprazole 40 MG tablet Commonly known as:  PROTONIX Take 1 tablet (40 mg total) by mouth daily.   sertraline 25 MG tablet Commonly known as:  ZOLOFT Take 25 mg by mouth daily.   simvastatin 40 MG tablet Commonly known as:  ZOCOR Take 40 mg by mouth at bedtime.   spironolactone 25 MG tablet Commonly known as:  ALDACTONE Take 25 mg by mouth daily.      No Known Allergies    The results of significant diagnostics from this hospitalization (including imaging, microbiology, ancillary and laboratory) are listed below for reference.    Significant Diagnostic Studies: Dg Chest 2 View  Result Date: 08/12/2018 CLINICAL DATA:  Chest pain a few hours ago. No pain is noted at this time. EXAM: CHEST - 2 VIEW COMPARISON:  None. FINDINGS: The heart size and mediastinal contours are within normal limits. Both lungs are clear. The visualized skeletal structures are unremarkable. Mediastinum is mildly prominent secondary to patient rotation to the right. IMPRESSION: No active cardiopulmonary disease. Electronically Signed   By: Inez Catalina M.D.   On: 08/12/2018 17:00    Microbiology: No results found for this or any previous visit (from the past 240 hour(s)).   Labs: Basic Metabolic Panel: Recent Labs  Lab 08/12/18 1514 08/12/18 1857  NA 141  --   K 4.1  --   CL 100  --   CO2 31  --   GLUCOSE 90  --   BUN 12  --   CREATININE 0.90 0.96  CALCIUM 9.2  --    Liver Function Tests: No results for input(s): AST, ALT, ALKPHOS, BILITOT, PROT, ALBUMIN in the last 168 hours. No results for input(s): LIPASE, AMYLASE in the last 168 hours. No results for input(s): AMMONIA in the last 168 hours. CBC: Recent Labs  Lab 08/12/18 1514 08/12/18 1857  WBC 10.7* 10.0  HGB 12.7 12.6  HCT 40.7 39.7  MCV 92.5 93.0  PLT 139* 121*   Cardiac Enzymes: No results for input(s): CKTOTAL, CKMB, CKMBINDEX, TROPONINI in the last 168 hours. BNP: BNP (last 3 results) Recent  Labs    08/12/18 1515  BNP 33.7    ProBNP (last 3 results) No results for input(s): PROBNP in the last 8760 hours.  CBG: No results for input(s): GLUCAP in the last 168 hours.     Signed:  Nita Sells MD   [facility] 08/13/2018, 10:21 AM

## 2018-08-13 NOTE — ED Notes (Signed)
Pt stable, ambulatory, states understanding of discharge instructions and prescription pickup

## 2020-03-21 ENCOUNTER — Emergency Department (HOSPITAL_COMMUNITY): Payer: Medicare Other

## 2020-03-21 ENCOUNTER — Emergency Department (HOSPITAL_COMMUNITY)
Admission: EM | Admit: 2020-03-21 | Discharge: 2020-03-21 | Disposition: A | Discharge status: Home / Self Care | Payer: Medicare Other | Attending: Emergency Medicine | Admitting: Emergency Medicine

## 2020-03-21 ENCOUNTER — Encounter (HOSPITAL_COMMUNITY): Payer: Self-pay | Admitting: Emergency Medicine

## 2020-03-21 ENCOUNTER — Other Ambulatory Visit: Payer: Self-pay

## 2020-03-21 DIAGNOSIS — W01198A Fall on same level from slipping, tripping and stumbling with subsequent striking against other object, initial encounter: Secondary | ICD-10-CM | POA: Insufficient documentation

## 2020-03-21 DIAGNOSIS — Z7901 Long term (current) use of anticoagulants: Secondary | ICD-10-CM | POA: Diagnosis not present

## 2020-03-21 DIAGNOSIS — Z79899 Other long term (current) drug therapy: Secondary | ICD-10-CM | POA: Insufficient documentation

## 2020-03-21 DIAGNOSIS — S0990XA Unspecified injury of head, initial encounter: Secondary | ICD-10-CM

## 2020-03-21 DIAGNOSIS — I10 Essential (primary) hypertension: Secondary | ICD-10-CM | POA: Insufficient documentation

## 2020-03-21 DIAGNOSIS — Y92019 Unspecified place in single-family (private) house as the place of occurrence of the external cause: Secondary | ICD-10-CM | POA: Diagnosis not present

## 2020-03-21 DIAGNOSIS — R519 Headache, unspecified: Secondary | ICD-10-CM | POA: Diagnosis present

## 2020-03-21 DIAGNOSIS — W19XXXA Unspecified fall, initial encounter: Secondary | ICD-10-CM

## 2020-03-21 DIAGNOSIS — Y939 Activity, unspecified: Secondary | ICD-10-CM | POA: Insufficient documentation

## 2020-03-21 DIAGNOSIS — Y999 Unspecified external cause status: Secondary | ICD-10-CM | POA: Diagnosis not present

## 2020-03-21 DIAGNOSIS — M545 Low back pain: Secondary | ICD-10-CM | POA: Diagnosis not present

## 2020-03-21 DIAGNOSIS — Z7982 Long term (current) use of aspirin: Secondary | ICD-10-CM | POA: Insufficient documentation

## 2020-03-21 DIAGNOSIS — Y92009 Unspecified place in unspecified non-institutional (private) residence as the place of occurrence of the external cause: Secondary | ICD-10-CM

## 2020-03-21 LAB — CBG MONITORING, ED: Glucose-Capillary: 75 mg/dL (ref 70–99)

## 2020-03-21 MED ORDER — ACETAMINOPHEN 325 MG PO TABS: 650.0000 mg | ORAL_TABLET | Freq: Once | ORAL | Status: DC

## 2020-03-21 NOTE — Discharge Instructions (Addendum)
Continue your home medications as previously prescribed. Return to the ER if you start to experience worsening headache, blurry vision, numbness in arms or legs, additional injuries or falls.

## 2020-03-21 NOTE — Progress Notes (Signed)
Orthopedic Tech Progress Note Patient Details:  Audrey Weber 1946/06/10 [id number]  Patient ID: Audrey Weber, female   DOB: 17-Jun-1946, 74 y.o.   MRN: [id number]   Audrey Weber 03/21/2020, 3:56 PMLEVEL 2 TRAUMA ALERT

## 2020-03-21 NOTE — ED Provider Notes (Signed)
[facility] EMERGENCY DEPARTMENT Provider Note   CSN: [id number] Arrival date & time: 03/21/20  1448     History Chief Complaint  Patient presents with  . Fall    Audrey Weber is a 74 y.o. female with a past medical history of hypertension, currently anticoagulated on Eliquis started 1 month ago presenting to the ED after fall that occurred prior to arrival.  Patient presents as a level 2 trauma.  She was cleaning her house, putting things away when she had a mechanical fall which caused her to fall forward, hit the top of her head on the windowsill.  She fell onto the ground.  She denies any loss of consciousness.  She was able to help get up with assistance of her daughter that lives with her.  She ambulates with a walker at baseline.  She has been taking her Eliquis every day as directed, her daughter helps her take her medications on time.  She reports lower back pain as well.  Reports remote history of prior back surgeries over 10 years ago.  She denies any neck pain, vision changes, vomiting, numbness in arms or legs, chest pain, abdominal pain.  HPI     Past Medical History:  Diagnosis Date  . Hypertension     Patient Active Problem List   Diagnosis Date Noted  . Chest pain 08/12/2018    Past Surgical History:  Procedure Laterality Date  . ABDOMINAL HYSTERECTOMY       OB History   No obstetric history on file.     No family history on file.  Social History   Tobacco Use  . Smoking status: Never Smoker  . Smokeless tobacco: Never Used  Substance Use Topics  . Alcohol use: No  . Drug use: No    Home Medications Prior to Admission medications   Medication Sig Start Date End Date Taking? Authorizing Provider  aspirin EC 81 MG tablet Take 81 mg by mouth daily.    [provider]  furosemide (LASIX) 40 MG tablet Take 40 mg by mouth daily.     [provider]  nadolol (CORGARD) 40 MG tablet Take 40 mg by mouth daily.     [provider]  pantoprazole (PROTONIX) 40 MG tablet Take 1 tablet (40 mg total) by mouth daily. 08/13/18 08/13/19  Nita Sells, MD  sertraline (ZOLOFT) 25 MG tablet Take 25 mg by mouth daily.    [provider]  simvastatin (ZOCOR) 40 MG tablet Take 40 mg by mouth at bedtime.    [provider]  spironolactone (ALDACTONE) 25 MG tablet Take 25 mg by mouth daily.    [provider]    Allergies    Patient has no known allergies.  Review of Systems   Review of Systems  Constitutional: Negative for appetite change, chills and fever.  HENT: Negative for ear pain, rhinorrhea, sneezing and sore throat.   Eyes: Negative for photophobia and visual disturbance.  Respiratory: Negative for cough, chest tightness, shortness of breath and wheezing.   Cardiovascular: Negative for chest pain and palpitations.  Gastrointestinal: Negative for abdominal pain, blood in stool, constipation, diarrhea, nausea and vomiting.  Genitourinary: Negative for dysuria, hematuria and urgency.  Musculoskeletal: Positive for back pain. Negative for myalgias.  Skin: Negative for rash.  Neurological: Positive for headaches. Negative for dizziness, weakness and light-headedness.    Physical Exam Updated Vital Signs BP 107/80   Pulse 71   Temp 97.8 F (36.6 C)  Resp 16   Ht 5' (1.524 m)   Wt 101.6 kg   SpO2 97%   BMI 43.75 kg/m   Physical Exam Vitals and nursing note reviewed.  Constitutional:      General: She is not in acute distress.    Appearance: She is well-developed.  HENT:     Head: Normocephalic and atraumatic.     Nose: Nose normal.  Eyes:     General: No scleral icterus.       Right eye: No discharge.        Left eye: No discharge.     Conjunctiva/sclera: Conjunctivae normal.     Pupils: Pupils are equal, round, and reactive to light.  Cardiovascular:     Rate and Rhythm: Normal rate and regular rhythm.     Heart sounds: Normal heart sounds. No  murmur. No friction rub. No gallop.   Pulmonary:     Effort: Pulmonary effort is normal. No respiratory distress.     Breath sounds: Normal breath sounds.  Abdominal:     General: Bowel sounds are normal. There is no distension.     Palpations: Abdomen is soft.     Tenderness: There is no abdominal tenderness. There is no guarding.  Musculoskeletal:        General: Normal range of motion.     Cervical back: Normal range of motion and neck supple.     Comments: Tenderness to palpation of the lumbar spine at the midline paraspinal musculature.  No midline spinal tenderness present in thoracic or cervical spine. No step-off palpated. No visible bruising, edema or temperature change noted. No objective signs of numbness present. No saddle anesthesia. 2+ DP pulses bilaterally. Sensation intact to light touch. Strength 5/5 in bilateral lower extremities.  Skin:    General: Skin is warm and dry.     Findings: No rash.  Neurological:     General: No focal deficit present.     Mental Status: She is alert.     Cranial Nerves: No cranial nerve deficit.     Sensory: No sensory deficit.     Motor: No weakness or abnormal muscle tone.     Coordination: Coordination normal.     Comments: Alert, oriented to place, situation, family members.  Believes it is March 2011 "I'm not sure if that's the year, but I know it has a "1" in it." PERRL. No facial asymmetry noted.     ED Results / Procedures / Treatments   Labs (all labs ordered are listed, but only abnormal results are displayed) Labs Reviewed  CBG MONITORING, ED    EKG None  Radiology DG Lumbar Spine Complete  Result Date: 03/21/2020 CLINICAL DATA:  Fall, lower back pain EXAM: LUMBAR SPINE - COMPLETE 4+ VIEW COMPARISON:  07/27/2017 FINDINGS: Frontal, bilateral oblique, and lateral views of the lumbar spine are obtained. No acute displaced fracture. Stable facet hypertrophy lower lumbar spine most pronounced at L4-5 and L5-S1. Disc space  height is relatively well preserved, with mild diffuse anterior osteophyte formation. Bilateral sacroiliac joints are normal. Symmetrical hip osteoarthritis. IMPRESSION: 1. Stable multilevel degenerative changes.  No acute fracture. Electronically Signed   By: Randa Ngo M.D.   On: 03/21/2020 16:28   CT Head Wo Contrast  Result Date: 03/21/2020 CLINICAL DATA:  Acute pain due to trauma. EXAM: CT HEAD WITHOUT CONTRAST TECHNIQUE: Contiguous axial images were obtained from the base of the skull through the vertex without intravenous contrast. COMPARISON:  None. FINDINGS: Brain: No evidence  of acute infarction, hemorrhage, hydrocephalus, extra-axial collection or mass lesion/mass effect. Atrophy and extensive microvascular ischemic changes are noted. There is encephalomalacia within the left cerebellum. Vascular: No hyperdense vessel or unexpected calcification. Skull: The patient is status post left posterior fossa craniectomy. There is no acute displaced fracture. There is a burr hole through the right frontal bone. Sinuses/Orbits: No acute finding. Other: None. IMPRESSION: 1. No acute intracranial abnormality. 2. Chronic findings as detailed above including atrophy, chronic microvascular ischemic changes, and encephalomalacia involving the left cerebellum. Electronically Signed   By: Constance Holster M.D.   On: 03/21/2020 16:05    Procedures Procedures (including critical care time)  Medications Ordered in ED Medications  acetaminophen (TYLENOL) tablet 650 mg (has no administration in time range)    ED Course  I have reviewed the triage vital signs and the nursing notes.  Pertinent labs & imaging results that were available during my care of the patient were reviewed by me and considered in my medical decision making (see chart for details).    MDM Rules/Calculators/A&P                      74 year old female with a past medical history of hypertension, currently anticoagulated on Eliquis  presenting to the ED as a level 2 fall that occurred prior to arrival.  She was cleaning her house when she had a mechanical fall, causing her to fall forward, hit the top of her head on a windowsill.  She denies any loss of consciousness.  Reports pain in the site of the injury but denies any vision changes, numbness in arms or legs, neck pain, vomiting.  She has some tenderness palpation of the lumbar spine at the midline and paraspinal musculature.  At baseline she is ambulatory with a walker.  She resides at home with her daughter.  No deficits neurological exam noted.  She is moving all extremities without difficulty.  No facial asymmetry noted.  Vital signs are within normal limits, she is hemodynamically stable.  No signs of trauma on her scalp.  CT of the head is negative for acute abnormality. Xray of the lumbar spine without any acute abnormalities.  Patient's pain controlled here in the ER.  Patient will be discharged home with PCP follow-up, head injury precautions.  Patient is hemodynamically stable, in NAD. Evaluation does not show pathology that would require ongoing emergent intervention or inpatient treatment. I have personally reviewed and interpreted all lab work and imaging at today's ED visit. I explained the diagnosis to the patient. Pain has been managed and has no complaints prior to discharge. Patient is comfortable with above plan and is stable for discharge at this time. All questions were answered prior to disposition. Strict return precautions for returning to the ED were discussed. Encouraged follow up with PCP.   An After Visit Summary was printed and given to the patient.   Portions of this note were generated with [employer]. Dictation errors may occur despite best attempts at proofreading.   Final Clinical Impression(s) / ED Diagnoses Final diagnoses:  Fall in home, initial encounter  Injury of head, initial encounter    Rx / DC Orders ED Discharge  Orders    None       Delia Heady, PA-C 03/21/20 1708    Little, Wenda Overland, MD 03/22/20 1756

## 2020-03-21 NOTE — ED Notes (Signed)
Charge made aware of fall on thinners

## 2020-03-21 NOTE — ED Triage Notes (Signed)
Pt reports falling today and hitting top of her head on window sill. Pt was trying to move without her walker. Pt endorses head pain. Pt started on eliquis a month ago.

## 2021-03-15 ENCOUNTER — Emergency Department (HOSPITAL_COMMUNITY)
Admission: EM | Admit: 2021-03-15 | Discharge: 2021-03-15 | Disposition: A | Discharge status: Home / Self Care | Payer: Medicare Other | Attending: Emergency Medicine | Admitting: Emergency Medicine

## 2021-03-15 ENCOUNTER — Emergency Department (HOSPITAL_COMMUNITY): Payer: Medicare Other

## 2021-03-15 ENCOUNTER — Encounter (HOSPITAL_COMMUNITY): Payer: Self-pay

## 2021-03-15 DIAGNOSIS — R000 Tachycardia, unspecified: Secondary | ICD-10-CM | POA: Diagnosis present

## 2021-03-15 DIAGNOSIS — I10 Essential (primary) hypertension: Secondary | ICD-10-CM | POA: Diagnosis not present

## 2021-03-15 DIAGNOSIS — Z79899 Other long term (current) drug therapy: Secondary | ICD-10-CM | POA: Diagnosis not present

## 2021-03-15 DIAGNOSIS — Z7982 Long term (current) use of aspirin: Secondary | ICD-10-CM | POA: Insufficient documentation

## 2021-03-15 LAB — BASIC METABOLIC PANEL
Anion gap: 8 (ref 5–15)
BUN: 16 mg/dL (ref 8–23)
CO2: 26 mmol/L (ref 22–32)
Calcium: 9.2 mg/dL (ref 8.9–10.3)
Chloride: 100 mmol/L (ref 98–111)
Creatinine, Ser: 0.81 mg/dL (ref 0.44–1.00)
GFR, Estimated: >60 mL/min (ref >60)
Glucose, Bld: 103 mg/dL — ABNORMAL HIGH (ref 70–99)
Potassium: 4.2 mmol/L (ref 3.5–5.1)
Sodium: 134 mmol/L — ABNORMAL LOW (ref 135–145)

## 2021-03-15 LAB — HEPATIC FUNCTION PANEL
ALT: 25 U/L (ref 0–44)
AST: 56 U/L — ABNORMAL HIGH (ref 15–41)
Albumin: 2.4 g/dL — ABNORMAL LOW (ref 3.5–5.0)
Alkaline Phosphatase: 104 U/L (ref 38–126)
Bilirubin, Direct: 0.1 mg/dL (ref 0.0–0.2)
Indirect Bilirubin: 0.6 mg/dL (ref 0.3–0.9)
Total Bilirubin: 0.7 mg/dL (ref 0.3–1.2)
Total Protein: 7.5 g/dL (ref 6.5–8.1)

## 2021-03-15 LAB — CBC WITH DIFFERENTIAL/PLATELET
Abs Immature Granulocytes: 0.02 10*3/uL (ref 0.00–0.07)
Basophils Absolute: 0 10*3/uL (ref 0.0–0.1)
Basophils Relative: 0 %
Eosinophils Absolute: 0 10*3/uL (ref 0.0–0.5)
Eosinophils Relative: 1 %
HCT: 44.3 % (ref 36.0–46.0)
Hemoglobin: 14.5 g/dL (ref 12.0–15.0)
Immature Granulocytes: 0 %
Lymphocytes Relative: 62 %
Lymphs Abs: 3.2 10*3/uL (ref 0.7–4.0)
MCH: 29.2 pg (ref 26.0–34.0)
MCHC: 32.7 g/dL (ref 30.0–36.0)
MCV: 89.3 fL (ref 80.0–100.0)
Monocytes Absolute: 0.5 10*3/uL (ref 0.1–1.0)
Monocytes Relative: 9 %
Neutro Abs: 1.5 10*3/uL — ABNORMAL LOW (ref 1.7–7.7)
Neutrophils Relative %: 28 %
Platelets: 143 10*3/uL — ABNORMAL LOW (ref 150–400)
RBC: 4.96 MIL/uL (ref 3.87–5.11)
RDW: 20.1 % — ABNORMAL HIGH (ref 11.5–15.5)
WBC: 5.3 10*3/uL (ref 4.0–10.5)
nRBC: 0 % (ref 0.0–0.2)

## 2021-03-15 MED ORDER — METOPROLOL TARTRATE 5 MG/5ML IV SOLN
5.0000 mg | Freq: Once | INTRAVENOUS | Status: AC
  Administered 2021-03-15: 5 mg via INTRAVENOUS
  Filled 2021-03-15: qty 5

## 2021-03-15 MED ORDER — METOPROLOL TARTRATE 25 MG PO TABS
37.5000 mg | ORAL_TABLET | Freq: Once | ORAL | Status: AC
  Administered 2021-03-15: 37.5 mg via JEJUNOSTOMY
  Filled 2021-03-15: qty 2

## 2021-03-15 MED ORDER — SODIUM CHLORIDE 0.9 % IV BOLUS
1000.0000 mL | Freq: Once | INTRAVENOUS | Status: AC
  Administered 2021-03-15: 1000 mL via INTRAVENOUS

## 2021-03-15 NOTE — ED Provider Notes (Addendum)
[facility] EMERGENCY DEPARTMENT Provider Note   CSN: [id number] Arrival date & time: 03/15/21  1341     History Chief Complaint  Patient presents with  . Tachycardia    Audrey Weber is a 75 y.o. female.  The history is provided by the patient.  Illness Severity:  Mild Onset quality:  Gradual Timing:  Intermittent Progression:  Waxing and waning Chronicity:  New Context:  Patient here for high heart rate. On Beta blocker and maybe a blood thinner. Denies chest pain, adbominal pain. Recent feeding tube place. Relieved by:  Nothing Worsened by:  Nothing Associated symptoms: no abdominal pain, no chest pain, no congestion, no cough, no diarrhea, no ear pain, no fatigue, no fever, no headaches, no loss of consciousness, no myalgias, no nausea, no rash, no rhinorrhea, no shortness of breath, no sore throat, no vomiting and no wheezing        Past Medical History:  Diagnosis Date  . Hypertension     Patient Active Problem List   Diagnosis Date Noted  . Chest pain 08/12/2018    Past Surgical History:  Procedure Laterality Date  . ABDOMINAL HYSTERECTOMY       OB History   No obstetric history on file.     No family history on file.  Social History   Tobacco Use  . Smoking status: Never Smoker  . Smokeless tobacco: Never Used  Substance Use Topics  . Alcohol use: No  . Drug use: No    Home Medications Prior to Admission medications   Medication Sig Start Date End Date Taking? Authorizing Provider  aspirin EC 81 MG tablet Take 81 mg by mouth daily.    [provider]  furosemide (LASIX) 40 MG tablet Take 40 mg by mouth daily.     [provider]  nadolol (CORGARD) 40 MG tablet Take 40 mg by mouth daily.    [provider]  pantoprazole (PROTONIX) 40 MG tablet Take 1 tablet (40 mg total) by mouth daily. 08/13/18 08/13/19  Nita Sells, MD  sertraline (ZOLOFT) 25 MG tablet Take 25 mg by mouth daily.     [provider]  simvastatin (ZOCOR) 40 MG tablet Take 40 mg by mouth at bedtime.    [provider]  spironolactone (ALDACTONE) 25 MG tablet Take 25 mg by mouth daily.    [provider]    Allergies    Patient has no known allergies.  Review of Systems   Review of Systems  Constitutional: Negative for chills, fatigue and fever.  HENT: Negative for congestion, ear pain, rhinorrhea and sore throat.   Eyes: Negative for pain and visual disturbance.  Respiratory: Negative for cough, shortness of breath and wheezing.   Cardiovascular: Negative for chest pain and palpitations.  Gastrointestinal: Negative for abdominal pain, diarrhea, nausea and vomiting.  Genitourinary: Negative for dysuria and hematuria.  Musculoskeletal: Negative for arthralgias, back pain and myalgias.  Skin: Negative for color change and rash.  Neurological: Negative for seizures, loss of consciousness, syncope and headaches.  All other systems reviewed and are negative.   Physical Exam Updated Vital Signs  ED Triage Vitals  Enc Vitals Group     BP 03/15/21 1346 111/88     Pulse Rate 03/15/21 1346 (!) 119     Resp 03/15/21 1346 (!) 22     Temp 03/15/21 1346 98.5 F (36.9 C)     Temp Source 03/15/21 1346 Oral     SpO2 03/15/21 1346 97 %  Weight --      Height --      Head Circumference --      Peak Flow --      Pain Score 03/15/21 1347 0     Pain Loc --      Pain Edu? --      Excl. in [place]? --     Physical Exam Vitals and nursing note reviewed.  Constitutional:      General: She is not in acute distress.    Appearance: She is well-developed. She is not ill-appearing.  HENT:     Head: Normocephalic and atraumatic.     Nose: Nose normal.     Mouth/Throat:     Mouth: Mucous membranes are moist.  Eyes:     Extraocular Movements: Extraocular movements intact.     Conjunctiva/sclera: Conjunctivae normal.     Pupils: Pupils are equal, round, and reactive to light.   Cardiovascular:     Rate and Rhythm: Regular rhythm. Tachycardia present.     Pulses: Normal pulses.     Heart sounds: Normal heart sounds. No murmur heard.   Pulmonary:     Effort: Pulmonary effort is normal. No respiratory distress.     Breath sounds: Normal breath sounds.  Abdominal:     General: Abdomen is flat.     Palpations: Abdomen is soft.     Tenderness: There is no abdominal tenderness.  Musculoskeletal:     Cervical back: Normal range of motion and neck supple.  Skin:    General: Skin is warm and dry.     Capillary Refill: Capillary refill takes less than 2 seconds.  Neurological:     General: No focal deficit present.     Mental Status: She is alert.  Psychiatric:        Mood and Affect: Mood normal.     ED Results / Procedures / Treatments   Labs (all labs ordered are listed, but only abnormal results are displayed) Labs Reviewed  CBC WITH DIFFERENTIAL/PLATELET  BASIC METABOLIC PANEL  HEPATIC FUNCTION PANEL  URINALYSIS, ROUTINE W REFLEX MICROSCOPIC    EKG EKG Interpretation  Date/Time:  Thursday March 15 2021 13:48:16 EDT Ventricular Rate:  122 PR Interval:    QRS Duration: 86 QT Interval:  324 QTC Calculation: 462 R Axis:   -29 Text Interpretation: Sinus tachycardia Consider left ventricular hypertrophy Confirmed by Lennice Sites [phone]) on 03/15/2021 1:52:01 PM   Radiology No results found.  Procedures Procedures   Medications Ordered in ED Medications  sodium chloride 0.9 % bolus 1,000 mL (1,000 mLs Intravenous New Bag/Given 03/15/21 1431)  metoprolol tartrate (LOPRESSOR) injection 5 mg (5 mg Intravenous Given 03/15/21 1431)    ED Course  I have reviewed the triage vital signs and the nursing notes.  Pertinent labs & imaging results that were available during my care of the patient were reviewed by me and considered in my medical decision making (see chart for details).    MDM Rules/Calculators/A&P                          Audrey Weber is here with fast heart rate.  Patient tachycardic in the low 100s upon arrival.  Sinus tachycardia on EKG.  No ischemic changes.  History of the same. HR fluctuating between 105-140 but does not appear to be afib. Sinus rhytmn on rhythm strips.  Not sure she has been taking her beta-blocker.  Might be on a blood thinner.  Need to obtain further history from family member.  Tried to call but they did not pick up.  Upon chart review it appears that she has had some fast heart rates recently.  No evidence to suggest history of A. fib on chart review.  She just got a feeding tube recently and was admitted for UTI recently as well.  She has had failure to thrive symptoms for a long time.  She does have home health.  Overall possibly tachycardia secondary to underlying sinus tachycardia but could be from dehydration.  Will give IV fluid bolus and give a dose of IV Lopressor.  Will check basic labs as well as urinalysis for infection and chest x-ray.  However, she does not endorse any infectious symptoms.  She denies any chest pain or shortness of breath.  Heart rate has improved to the 90s following IV Lopressor.  Patient is in a sinus rhythm.  Will give fluid bolus and check basic labs and evaluate for any signs of dehydration.    Family arrived just prior to handoff.  They have been having a hard time given her medications by mouth.  I talked to pharmacy and we are able to give metoprolol through feeding tube.  Suspect that tachycardia is likely from missed doses of metoprolol.  She has responded well to IV Lopressor.  Will give her home dose to the feeding tube at this time and continue evaluating for other signs to suggest dehydration or infection.  Suspect the tachycardia is secondary to missed doses.  Please see oncoming ED doctor's note for further results, evaluation, disposition of the patient.  This chart was dictated using voice recognition software.  Despite best efforts to proofread,  errors can  occur which can change the documentation meaning.   Final Clinical Impression(s) / ED Diagnoses Final diagnoses:  Tachycardia    Rx / DC Orders ED Discharge Orders    None       Lennice Sites, DO 03/15/21 [place], [place], DO 03/15/21 1445

## 2021-03-15 NOTE — ED Triage Notes (Signed)
Pt. Bib by ems from home due to elevated heart rate. Pt. Stopped taking her metoprolol. Pt. States she is unsure when she last had her medication and her daughter usually gives her the medication. Pt. Current VS BP 111/88 HR 120 SPO2 99% RR 22

## 2021-03-15 NOTE — ED Provider Notes (Signed)
Signed out that patient receiving meds/fluids, to d/c to home.  Labs neg for acute process.   Currently hr 84, rr 14, pulse ox 98%. Patient denies any pain or other c/o. No sob. No faintness or dizziness. No fever or chills. No dysuria.   Patient currently appears stable for d/c.   Rec pcp f/u.  Return precautions provided.      Lajean Saver, MD 03/15/21 [phone]

## 2021-03-15 NOTE — Discharge Instructions (Addendum)
It was our pleasure to provide your ER care today - we hope that you feel better.  Rest. Make sure to stay well hydrated and get adequate nutrition via your tube feeding.  You may supplement your nutrition with Ensure, Boost, or other nutritious shake.   Follow up with primary care doctor in the coming week for recheck - call office tomorrow AM to arrange follow up with them.   Return to ER if worse, new symptoms, fevers, persistent fast heart beating, trouble breathing, vomiting, fainting, or other concern.

## 2021-03-15 NOTE — ED Notes (Signed)
[facility] called  

## 2021-06-18 ENCOUNTER — Other Ambulatory Visit: Payer: Self-pay | Admitting: Internal Medicine

## 2021-06-20 ENCOUNTER — Other Ambulatory Visit: Payer: Self-pay | Admitting: Internal Medicine

## 2021-06-20 ENCOUNTER — Telehealth: Payer: Self-pay | Admitting: Internal Medicine

## 2021-06-20 DIAGNOSIS — C220 Liver cell carcinoma: Secondary | ICD-10-CM

## 2021-06-20 DIAGNOSIS — K7469 Other cirrhosis of liver: Secondary | ICD-10-CM

## 2021-06-20 NOTE — Telephone Encounter (Signed)
Hey Dr. Henrene Pastor,   We received a referral for cirrhosis of the liver. The patient was previous seen at [facility] in [facility] but is now at [facility] and need a local GI Dr here in [place]. Records are in care everywhere. As DOD (6/27PM), could you please review and advise on scheduling?  Thank you.

## 2021-06-20 NOTE — Telephone Encounter (Signed)
Request received to transfer GI care from outside practice to [facility].  We appreciate the interest in our practice, however at this time due to high demand from patients without established GI providers we cannot accommodate this transfer.  Ability to accommodate future transfer requests may change over time and the patient can contact us again in 6-12 months if still interested in being seen at  GI.      °

## 2021-06-22 NOTE — Telephone Encounter (Signed)
Spoke with Leda Gauze at [facility]. Informed her of recommendation from Dr. Henrene Pastor

## 2021-06-26 ENCOUNTER — Other Ambulatory Visit (HOSPITAL_COMMUNITY): Payer: Self-pay | Admitting: Internal Medicine

## 2021-06-26 DIAGNOSIS — K7469 Other cirrhosis of liver: Secondary | ICD-10-CM

## 2021-06-26 DIAGNOSIS — C220 Liver cell carcinoma: Secondary | ICD-10-CM

## 2021-07-09 ENCOUNTER — Ambulatory Visit (HOSPITAL_COMMUNITY): Admission: RE | Admit: 2021-07-09 | Payer: Medicare (Managed Care) | Source: Ambulatory Visit

## 2021-08-02 ENCOUNTER — Ambulatory Visit (HOSPITAL_COMMUNITY)
Admission: RE | Admit: 2021-08-02 | Discharge: 2021-08-02 | Disposition: A | Discharge status: Home / Self Care | Payer: Medicare (Managed Care) | Source: Ambulatory Visit | Attending: Internal Medicine | Admitting: Internal Medicine

## 2021-08-02 ENCOUNTER — Other Ambulatory Visit: Payer: Self-pay

## 2021-08-02 DIAGNOSIS — K7469 Other cirrhosis of liver: Secondary | ICD-10-CM | POA: Diagnosis present

## 2021-08-02 DIAGNOSIS — C220 Liver cell carcinoma: Secondary | ICD-10-CM | POA: Diagnosis present

## 2021-08-02 MED ORDER — GADOBUTROL 1 MMOL/ML IV SOLN
8.0000 mL | Freq: Once | INTRAVENOUS | Status: AC | PRN
  Administered 2021-08-02: 8 mL via INTRAVENOUS

## 2021-08-13 ENCOUNTER — Telehealth: Payer: Self-pay | Admitting: Nurse Practitioner

## 2021-08-13 NOTE — Telephone Encounter (Signed)
A new pt appt has been schedueld for [name] to see Regan Rakers on 9/19 at 11am. Appt date and time has been given to Audrey Weber from [facility]. I was unable to get the pt scheduled for a sooner appt due to [place] [facility] the [facility]'s transportation schedule.

## 2021-09-09 NOTE — Progress Notes (Addendum)
[place]   [phone] Fax:([phone] [place] Note   Patient Care Team: [facility], [place] (Inactive) as PCP - General Date of Service: 09/10/2021   CHIEF COMPLAINTS/PURPOSE OF CONSULTATION:  Hepatocellular carcinoma  SUMMARY OF ONCOLOGY HISTORY  Oncology History  Hepatocellular carcinoma ([place])  12/30/2019 Imaging   CT CAP impression [facility]  1.  No convincing evidence of pancreatitis or complications thereof, within the limitations of noncontrast technique.  2.  There is a new 2.2 cm low-density lesion in hepatic segment 7. Given background cirrhosis, this warrants further evaluation with nonemergent hepatic protocol CT or MR.  3.  Patchy areas of consolidation in the lower lungs may be sequela of aspiration or pneumonia.   12/31/2019 Imaging   CT AP with contrast  1.  LI-RADS 4 lesion in segment 7 measuring 2.2 cm as further detailed above.  2.  Incidental note of pulmonary embolus within the right lower lobe, partially imaged.  3.  Cirrhotic morphology of the liver.    07/17/2020 Imaging   CT abdomen with contrast  1.  Compared to 12/31/2019, there has been interval increase in size of the LI-RADS 4 lesion in hepatic segment 7, which now measures 2.9 x 1.9 x 2.3 cm.  2.  Cirrhotic morphology of liver.   07/26/2020 Procedure   EGD -Mild gastritis and gastric antrum -No evidence of varices   09/26/2020 Imaging   MR liver with and without contrast  1.  Redemonstrated LIRADS 4 lesion in the central aspect of segment 7, slightly larger. No other lesions are identified. No adenopathy.  2.  Minimally complex left lower pole cystic lesion, attention on follow up.    10/03/2020 Procedure   Patient underwent TACE and liver biopsy at [facility]   10/03/2020 Initial Biopsy   Specimen A.  Final Diagnosis    A. LIVER, NEEDLE CORE BIOPSY II (SMEAR AND CELL BLOCK):              No definitive evidence of malignancy.                See COMMENT.   Electronically signed by Donzetta Sprung, MD on 10/04/2020 at  2:05 PM  Specimen A.  Adequacy  Satisfactory for evaluation.   Specimen B.  Final Interpretation    B. LIVER, NEEDLE CORE BIOPSY:              No definitive evidence of malignancy.               See COMMENT.    Specimen B.  Diagnosis Comment    The needle core biopsy shows liver parenchyma with well-formed cirrhosis, bile ductular proliferation and macro and microvesicular steatosis. No definitive evidence of hepatocellular carcinoma is identified on the limited core biopsy. Well-formed cirrhosis can could mimic a mass lesion however, an unsampled mass lesion cannot be entirely excluded. Re-biopsy for more diagnostic material is recommended if clinically indicated. The case was reviewed in consultation with Dr. Laurena Spies.      12/24/2020 Imaging   CT AP without contrast  1.  No acute abdominopelvic abnormalities.  2.  Increasing size of treated central posterior right hepatic lobe lesion, evaluation is otherwise limited given lack of intravenous contrast. Consider multiphase CT or MRI with contrast of the abdomen for further evaluation.  3.  Right adnexal cyst with septation and calcification. Cystic tumor can have this appearance. Ultrasound suggested for further characterization.  4.  Ancillary  findings as above.   01/09/2021 Imaging   MRI liver with and without contrast 1.  Continued increase in size of the treated posterior right hepatic lobe lesion favored to reflect posttreatment changes. No enhancement to suggest residual disease. No new lesions.  2.  Partially imaged defect along the posterior aspect of the descending thoracic aorta which may reflect unstable plaque or focal dissection. In retrospect, this was present on the chest CT dated 11/07/2020. Additional area of unstable plaque along the anterior aspect of the infrarenal abdominal aorta. Recommend CTA chest/abdomen/pelvis and outpatient vascular  surgery referral.   02/10/2021 Imaging   CTAP without contrast  1.  No acute findings in the abdomen or pelvis.  2.  Scattered sub-6 mm nodules in the imaged lungs, nonspecific, though may be infectious or inflammatory nature. There is also tree-in-bud nodularity suggesting immaturity/infectious etiologies. Attention on follow-up examinations.  3.  Similar appearance of the treated posterior right hepatic lesion, limited evaluation in the absence of IV contrast. Consider multiphase CT or MRI with contrast of the abdomen for further evaluation as suggested on prior CT.  Similar right adnexal cyst. Ultrasound again suggested for further characterization.  4.  Ancillary  findings as above.   08/02/2021 Imaging   MR abdomen with and without contrast IMPRESSION: Cirrhosis. 5.6 cm treated lesion in the medial right hepatic lobe, without findings to suggest viable tumor.     09/10/2021 Initial Diagnosis   Hepatocellular carcinoma ([place])   09/10/2021 Cancer Staging   Staging form: Liver, AJCC 8th Edition - Clinical stage from 09/10/2021: Stage IB (cT1b, cN0, cM0) - Signed by Alla Feeling, NP on 09/10/2021     HISTORY OF PRESENTING ILLNESS:  Isyss Patella 75 y.o. 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 She was incidentally found to have 2.2 cm low-density lesion in hepatic segment 7, LI-RADS 4 when hospitalized in 12/2019 for COVID. This was confirmed on dedicated CT abdomen w contrast 12/31/19 which also showed a partially imaged RLL pulmonary embolus. EGD 07/30/20 showed mild gastritis, no varices. Liver MRI 09/26/2020 showed interval enlargement of the central hepatic lobe lesion to 2.6 x 2.2 x. 2.3 cm still LI-RADS 4, suspicious for HCC.  Tumor markers AFP and CA 19-9 were reportedly normal. She underwent liver biopsy  and TACE same day 10/03/20, path was negative for malignancy. F/up CT AP wo contrast 11/06/20 showed treatment changes to segment 7 and 8. There was mention of right adnexal cyst. CT AP wo contrast 12/24/20 showed increasing size of treated central posterior right hepatic lobe lesion. She underwent MR abdomen w/wo contrast 08/02/21 which showed treated lesion without evidence of viable tumor. She was referred to GI upon moving to [facility] but due to back log has not been seen.   Socially, she is divorced, lives with her daughter Dewaine Oats who is here today.  She has 2 other adult daughters.  Patient is completely dependent with ADLs, does not drive.  She has a CNA 4 hours/day, daughter is starting to work so they will request more hours ([employer]). She is under the care of [place]. Formerly drank alcohol socially but none in years, a former intermittent cigarette smoker quit in 2000.  Denies other current drug use.  Denies known family history of cancer.  Today she presents in  a wheelchair with her daughter.  Feeling well in general.  Appetite is strong.  Dewaine Oats notes she did not tolerate TACE procedure, stopped eating and lost weight afterwards.  Since putting in feeding tube she has gained weight and is doing very well., she is doing tube feedings 2- 12 ounce cartons per day and po soft foods.  She drinks liquids through a sippy cup.  She wears a brief for urine and bowel movements.  Denies nausea/vomiting, constipation, diarrhea, bleeding, bloating, abdominal pain, recent cough, chest pain, dyspnea.  MEDICAL HISTORY:  Past Medical History:  Diagnosis Date   Hypertension     SURGICAL HISTORY: Past Surgical History:  Procedure Laterality Date   ABDOMINAL HYSTERECTOMY      SOCIAL HISTORY: Social History   Socioeconomic History   Marital status: Divorced    Spouse name: Not on file   Number of children: Not on file   Years of education: Not on file   Highest education level: Not on file   Occupational History   Not on file  Tobacco Use   Smoking status: Never   Smokeless tobacco: Never  Substance and Sexual Activity   Alcohol use: No   Drug use: No   Sexual activity: Not on file  Other Topics Concern   Not on file  Social History Narrative   Not on file   Social Determinants of Health   Financial Resource Strain: Not on file  Food Insecurity: Not on file  Transportation Needs: Not on file  Physical Activity: Not on file  Stress: Not on file  Social Connections: Not on file  Intimate Partner Violence: Not on file    FAMILY HISTORY: History reviewed. No pertinent family history.  ALLERGIES:  has No Known Allergies.  MEDICATIONS:  Current Outpatient Medications  Medication Sig Dispense Refill   apixaban (ELIQUIS) 5 MG TABS tablet Take 5 mg by mouth 2 (two) times daily.     famotidine (PEPCID) 20 MG tablet Take 20 mg by mouth 2 (two) times daily.     magnesium oxide (MAG-OX) 400 MG tablet Take 400 mg by mouth daily.     Metoprolol Tartrate 37.5 MG TABS Take 1 tablet by mouth in the morning and at bedtime.     sertraline (ZOLOFT) 50 MG tablet Take 50 mg by mouth daily.     simvastatin (ZOCOR) 40 MG tablet Take 40 mg by mouth every evening.     No current facility-administered medications for this visit.    REVIEW OF SYSTEMS:   Constitutional: Denies fevers, chills or abnormal night sweats Eyes: Denies blurriness of vision, double vision or watery eyes Ears, nose, mouth, throat, and face: Denies mucositis or sore throat Respiratory: Denies cough, dyspnea or wheezes Cardiovascular: Denies palpitation, chest discomfort or lower extremity swelling Gastrointestinal:  Denies nausea, vomiting, constipation, diarrhea, hematochezia, heartburn, change in bowel habits, bloating, or abdominal pain Skin: Denies abnormal skin rashes Lymphatics: Denies new lymphadenopathy or easy bruising Neurological:Denies numbness, tingling or new weaknesses (+) generally weak,  s/p CVA Behavioral/Psych: Mood is stable, no new changes  All other systems were reviewed with the patient and are negative.  PHYSICAL EXAMINATION: ECOG PERFORMANCE STATUS: total care   Vitals:   09/10/21 1128  BP: (!) 158/88  Pulse: 64  Resp: 16  Temp: 97.9 F (36.6 C)  SpO2: 100%   Filed Weights   09/10/21 1128  Weight: 196 lb 14.4 oz (89.3 kg)    GENERAL:alert, no distress and comfortable SKIN: No rash EYES: sclera  clear LYMPH:  no palpable cervical or supraclavicular lymphadenopathy  LUNGS: clear with normal breathing effort HEART: regular rate & rhythm, no lower extremity edema ABDOMEN:abdomen soft, non-tender and normal bowel sounds.  PEG tube to the left abdomen. ?Palpable liver Musculoskeletal:no cyanosis of digits and no clubbing  PSYCH: alert & oriented x 3 with fluent speech NEURO: generalized weakness  Exam performed in wheelchair  LABORATORY DATA:  I have reviewed the data as listed CBC Latest Ref Rng & Units 09/10/2021 03/15/2021 08/12/2018  WBC 4.0 - 10.5 K/uL 7.0 5.3 10.0  Hemoglobin 12.0 - 15.0 g/dL 12.3 14.5 12.6  Hematocrit 36.0 - 46.0 % 38.5 44.3 39.7  Platelets 150 - 400 K/uL 120(L) 143(L) 121(L)   CMP Latest Ref Rng & Units 09/10/2021 03/15/2021 08/12/2018  Glucose 70 - 99 mg/dL 107(H) 103(H) -  BUN 8 - 23 mg/dL 26(H) 16 -  Creatinine 0.44 - 1.00 mg/dL 1.03(H) 0.81 0.96  Sodium 135 - 145 mmol/L 142 134(L) -  Potassium 3.5 - 5.1 mmol/L 5.0 4.2 -  Chloride 98 - 111 mmol/L 108 100 -  CO2 22 - 32 mmol/L 27 26 -  Calcium 8.9 - 10.3 mg/dL 9.9 9.2 -  Total Protein 6.5 - 8.1 g/dL 8.3(H) 7.5 -  Total Bilirubin 0.3 - 1.2 mg/dL 0.5 0.7 -  Alkaline Phos 38 - 126 U/L 99 104 -  AST 15 - 41 U/L 24 56(H) -  ALT 0 - 44 U/L 16 25 -    RADIOGRAPHIC STUDIES: I have personally reviewed the radiological images as listed and agreed with the findings in the report. No results found.  ASSESSMENT & PLAN: 75 yo female   1.Liver lesion LIRADS 4 suspected HCC,  cT1bN0M0 stage IB -We reviewed her outside medical record in detail with the patient and family. -In the setting of treated hep C and cirrhosis, she had incidental LIRADS 4 liver lesion while hospitalized for COVID in 12/2019 that enlarged in 07/2020.  Staging work-up was negative for metastatic disease. -AFP reportedly normal -She underwent liver biopsy and TACE procedure at [facility] on 10/03/2020, path was not diagnostic for [facility] -Surveillance scans last 08/02/2021 showed no evidence of recurrent or metastatic disease -We recommend to continue observation for now, with baseline lab (CBC, CMP, AFP) today and repeat liver MRI in 4 months -We briefly discussed treatment options if she is found to have evidence of recurrent/metastatic disease in the future.  She did not tolerate initial TACE procedure well, with profound weight loss and deconditioning requiring PEG feeding tube and home health due to being total care at home.  Daughter is very involved in her care -Due to her medical comorbidities and low performance status, she is not a candidate for intensive work up or systemic treatment  -we reviewed s/sx of disease progression, such as unexplained fatigue, unintentional weight loss, abdominal pain, bloating, or signs of juandice. She knows to monitor  -lab today (CBC, CMP, AFP) and f/up with liver MRI in 4 months   2. HCV, cirrhosis  -she was diagnosed with hep C at least 20 years ago from IV drug use, treated with Harvoni. Per pt she did not undergo subsequent routine HCC screening  -she is off lasix and aldactone, no bloating or signs of ascites  -no recent drug or alcohol use -liver function is well compensated, normal albumin, LFTs and bili  -will refer to GI to f/up   3. H/o CVA -on Eliquis 5 mg BID, metoprolol, and statin -continue per PCP  PLAN: -work up and previous medical care reviewed -continue observation -lab today and 4 months with liver MRI -f/up in 4 months, after imaging   -GI referral to f/up cirrhosis   Orders Placed This Encounter  Procedures   MR LIVER W WO CONTRAST    Standing Status:   Future    Standing Expiration Date:   09/10/2022    Order Specific Question:   If indicated for the ordered procedure, I authorize the administration of contrast media per Radiology protocol    Answer:   Yes    Order Specific Question:   What is the patient's sedation requirement?    Answer:   No Sedation    Order Specific Question:   Does the patient have a pacemaker or implanted devices?    Answer:   No    Order Specific Question:   Preferred imaging location?    Answer:   [facility] (table limit - 550 lbs)   CBC with Differential (Cancer Center Only)    Standing Status:   Standing    Number of Occurrences:   50    Standing Expiration Date:   09/10/2022   CMP ([place] only)    Standing Status:   Standing    Number of Occurrences:   50    Standing Expiration Date:   09/10/2022   AFP tumor marker    Standing Status:   Standing    Number of Occurrences:   20    Standing Expiration Date:   09/10/2022   Ambulatory referral to Gastroenterology    Referral Priority:   Routine    Referral Type:   Consultation    Referral Reason:   Specialty Services Required    Number of Visits Requested:   1     All questions were answered. The patient knows to call the clinic with any problems, questions or concerns.     Alla Feeling, NP 09/11/2021    Addendum I have seen the patient, examined her. I agree with the assessment and and plan and have edited the notes.   75 yo female with PMH of substance abuse and treated hepatitis C, liver cirrhosis, who was incidentally found to have a 2.2 cm liver mass in segment 7 when she was hospitalized for COVID in January 2021.  She underwent liver biopsy and a TACE procedure at [facility] in 09/2020.  Biopsy was negative for malignancy.  She unfortunately developed significant fatigue and anorexia after procedure, and  became bedbound.  She eventually had feeding tube placed earlier this year, and has improved significantly since then.  However she is still not able to walk. Her appetite and eating is much improved, has gained weight, and less dependent on tube feeds.  I reviewed her recent MRI of abdomen with and without contrast from August 03, 2021, which showed treatment effect on the known liver mass, measuring 5.6 cm, no radiographic evidence of viable tumor.  No other new lesions.  We reviewed overall treatment plan for hepatocellular carcinoma, no indication for treatment at this point.  Given the high risk of recurrence, will monitor closely.  I briefly discussed the systemic treatment, and hopefully she will not need. Due to her advanced age and poor performance status, I plan to repeat scan and see her back in 4 months with repeated liver MRI. All questions were answered, patient and her daughter agrees with the plan. Will refer her to GI for f/u of liver cirrhosis.   Truitt Merle  09/10/2021 

## 2021-09-10 ENCOUNTER — Encounter: Payer: Self-pay | Admitting: Nurse Practitioner

## 2021-09-10 ENCOUNTER — Inpatient Hospital Stay: Payer: Medicare (Managed Care)

## 2021-09-10 ENCOUNTER — Other Ambulatory Visit: Payer: Self-pay

## 2021-09-10 ENCOUNTER — Inpatient Hospital Stay: Payer: Medicare (Managed Care) | Attending: Nurse Practitioner | Admitting: Nurse Practitioner

## 2021-09-10 VITALS — BP 158/88 | HR 64 | Temp 97.9°F | Resp 16 | Ht 60.0 in | Wt 196.9 lb

## 2021-09-10 DIAGNOSIS — Z79899 Other long term (current) drug therapy: Secondary | ICD-10-CM | POA: Diagnosis not present

## 2021-09-10 DIAGNOSIS — K769 Liver disease, unspecified: Secondary | ICD-10-CM | POA: Insufficient documentation

## 2021-09-10 DIAGNOSIS — Z8616 Personal history of COVID-19: Secondary | ICD-10-CM | POA: Diagnosis not present

## 2021-09-10 DIAGNOSIS — K7460 Unspecified cirrhosis of liver: Secondary | ICD-10-CM | POA: Insufficient documentation

## 2021-09-10 DIAGNOSIS — C220 Liver cell carcinoma: Secondary | ICD-10-CM

## 2021-09-10 DIAGNOSIS — K7469 Other cirrhosis of liver: Secondary | ICD-10-CM | POA: Diagnosis not present

## 2021-09-10 DIAGNOSIS — Z87891 Personal history of nicotine dependence: Secondary | ICD-10-CM | POA: Diagnosis not present

## 2021-09-10 DIAGNOSIS — Z8619 Personal history of other infectious and parasitic diseases: Secondary | ICD-10-CM | POA: Insufficient documentation

## 2021-09-10 DIAGNOSIS — Z8673 Personal history of transient ischemic attack (TIA), and cerebral infarction without residual deficits: Secondary | ICD-10-CM | POA: Insufficient documentation

## 2021-09-10 DIAGNOSIS — I10 Essential (primary) hypertension: Secondary | ICD-10-CM | POA: Diagnosis not present

## 2021-09-10 DIAGNOSIS — Z86711 Personal history of pulmonary embolism: Secondary | ICD-10-CM | POA: Insufficient documentation

## 2021-09-10 DIAGNOSIS — Z7901 Long term (current) use of anticoagulants: Secondary | ICD-10-CM | POA: Insufficient documentation

## 2021-09-10 LAB — CMP (CANCER CENTER ONLY)
ALT: 16 U/L (ref 0–44)
AST: 24 U/L (ref 15–41)
Albumin: 3.7 g/dL (ref 3.5–5.0)
Alkaline Phosphatase: 99 U/L (ref 38–126)
Anion gap: 7 (ref 5–15)
BUN: 26 mg/dL — ABNORMAL HIGH (ref 8–23)
CO2: 27 mmol/L (ref 22–32)
Calcium: 9.9 mg/dL (ref 8.9–10.3)
Chloride: 108 mmol/L (ref 98–111)
Creatinine: 1.03 mg/dL — ABNORMAL HIGH (ref 0.44–1.00)
GFR, Estimated: 56 mL/min — ABNORMAL LOW (ref >60)
Glucose, Bld: 107 mg/dL — ABNORMAL HIGH (ref 70–99)
Potassium: 5 mmol/L (ref 3.5–5.1)
Sodium: 142 mmol/L (ref 135–145)
Total Bilirubin: 0.5 mg/dL (ref 0.3–1.2)
Total Protein: 8.3 g/dL — ABNORMAL HIGH (ref 6.5–8.1)

## 2021-09-10 LAB — CBC WITH DIFFERENTIAL (CANCER CENTER ONLY)
Abs Immature Granulocytes: 0 10*3/uL (ref 0.00–0.07)
Basophils Absolute: 0 10*3/uL (ref 0.0–0.1)
Basophils Relative: 0 %
Eosinophils Absolute: 0 10*3/uL (ref 0.0–0.5)
Eosinophils Relative: 1 %
HCT: 38.5 % (ref 36.0–46.0)
Hemoglobin: 12.3 g/dL (ref 12.0–15.0)
Immature Granulocytes: 0 %
Lymphocytes Relative: 71 %
Lymphs Abs: 5 10*3/uL — ABNORMAL HIGH (ref 0.7–4.0)
MCH: 29.7 pg (ref 26.0–34.0)
MCHC: 31.9 g/dL (ref 30.0–36.0)
MCV: 93 fL (ref 80.0–100.0)
Monocytes Absolute: 0.3 10*3/uL (ref 0.1–1.0)
Monocytes Relative: 4 %
Neutro Abs: 1.7 10*3/uL (ref 1.7–7.7)
Neutrophils Relative %: 24 %
Platelet Count: 120 10*3/uL — ABNORMAL LOW (ref 150–400)
RBC: 4.14 MIL/uL (ref 3.87–5.11)
RDW: 15.4 % (ref 11.5–15.5)
WBC Count: 7 10*3/uL (ref 4.0–10.5)
nRBC: 0 % (ref 0.0–0.2)

## 2021-09-11 ENCOUNTER — Telehealth: Payer: Self-pay

## 2021-09-11 ENCOUNTER — Encounter: Payer: Self-pay | Admitting: Nurse Practitioner

## 2021-09-11 LAB — AFP TUMOR MARKER: AFP, Serum, Tumor Marker: 2.4 ng/mL (ref 0.0–9.2)

## 2021-09-11 NOTE — Telephone Encounter (Signed)
Patient has been made aware of lab results.

## 2021-09-11 NOTE — Telephone Encounter (Signed)
-----   Message from Alla Feeling, NP sent at 09/11/2021  9:52 AM EDT ----- Please let pt know AFP is normal, as it has been, and labs are stable. Encourage her to hydrate.  Thanks, Regan Rakers, NP

## 2021-11-13 ENCOUNTER — Other Ambulatory Visit (HOSPITAL_COMMUNITY): Payer: Self-pay | Admitting: Family Medicine

## 2021-11-13 DIAGNOSIS — F0150 Vascular dementia without behavioral disturbance: Secondary | ICD-10-CM

## 2021-11-20 ENCOUNTER — Ambulatory Visit (HOSPITAL_COMMUNITY)
Admission: RE | Admit: 2021-11-20 | Discharge: 2021-11-20 | Disposition: A | Discharge status: Home / Self Care | Payer: Medicare (Managed Care) | Source: Ambulatory Visit | Attending: Family Medicine | Admitting: Family Medicine

## 2021-11-20 ENCOUNTER — Other Ambulatory Visit: Payer: Self-pay

## 2021-11-20 DIAGNOSIS — F0150 Vascular dementia without behavioral disturbance: Secondary | ICD-10-CM | POA: Insufficient documentation

## 2021-11-20 DIAGNOSIS — Z431 Encounter for attention to gastrostomy: Secondary | ICD-10-CM | POA: Insufficient documentation

## 2021-11-20 HISTORY — PX: IR GASTROSTOMY TUBE REMOVAL: IMG5492

## 2021-11-20 MED ORDER — LIDOCAINE VISCOUS HCL 2 % MT SOLN
15.0000 mL | Freq: Once | OROMUCOSAL | Status: AC
  Administered 2021-11-20: 2 mL via ORAL
  Filled 2021-11-20: qty 15

## 2021-12-03 ENCOUNTER — Other Ambulatory Visit (HOSPITAL_COMMUNITY): Payer: Self-pay | Admitting: Family Medicine

## 2021-12-03 ENCOUNTER — Other Ambulatory Visit: Payer: Self-pay | Admitting: Family Medicine

## 2021-12-03 DIAGNOSIS — C220 Liver cell carcinoma: Secondary | ICD-10-CM

## 2021-12-27 ENCOUNTER — Telehealth: Payer: Self-pay | Admitting: Hematology

## 2021-12-27 NOTE — Telephone Encounter (Signed)
Left message with rescheduled upcoming appointment due to observed holiday.

## 2022-01-07 ENCOUNTER — Inpatient Hospital Stay: Payer: Medicare (Managed Care)

## 2022-01-10 ENCOUNTER — Ambulatory Visit (HOSPITAL_COMMUNITY)
Admission: RE | Admit: 2022-01-10 | Discharge: 2022-01-10 | Disposition: A | Discharge status: Home / Self Care | Payer: Medicare (Managed Care) | Source: Ambulatory Visit | Attending: Family Medicine | Admitting: Family Medicine

## 2022-01-10 ENCOUNTER — Other Ambulatory Visit: Payer: Self-pay

## 2022-01-10 DIAGNOSIS — C220 Liver cell carcinoma: Secondary | ICD-10-CM | POA: Diagnosis present

## 2022-01-10 MED ORDER — GADOBUTROL 1 MMOL/ML IV SOLN
9.0000 mL | Freq: Once | INTRAVENOUS | Status: AC | PRN
  Administered 2022-01-10: 9 mL via INTRAVENOUS

## 2022-01-11 ENCOUNTER — Inpatient Hospital Stay: Payer: Medicare (Managed Care) | Attending: Nurse Practitioner

## 2022-01-11 DIAGNOSIS — Z79899 Other long term (current) drug therapy: Secondary | ICD-10-CM | POA: Diagnosis not present

## 2022-01-11 DIAGNOSIS — Z8673 Personal history of transient ischemic attack (TIA), and cerebral infarction without residual deficits: Secondary | ICD-10-CM | POA: Diagnosis not present

## 2022-01-11 DIAGNOSIS — K769 Liver disease, unspecified: Secondary | ICD-10-CM | POA: Insufficient documentation

## 2022-01-11 DIAGNOSIS — Z7901 Long term (current) use of anticoagulants: Secondary | ICD-10-CM | POA: Diagnosis not present

## 2022-01-11 DIAGNOSIS — K7460 Unspecified cirrhosis of liver: Secondary | ICD-10-CM | POA: Insufficient documentation

## 2022-01-11 DIAGNOSIS — C220 Liver cell carcinoma: Secondary | ICD-10-CM

## 2022-01-11 LAB — CMP (CANCER CENTER ONLY)
ALT: 11 U/L (ref 0–44)
AST: 27 U/L (ref 15–41)
Albumin: 3.9 g/dL (ref 3.5–5.0)
Alkaline Phosphatase: 86 U/L (ref 38–126)
Anion gap: 7 (ref 5–15)
BUN: 9 mg/dL (ref 8–23)
CO2: 30 mmol/L (ref 22–32)
Calcium: 9.5 mg/dL (ref 8.9–10.3)
Chloride: 105 mmol/L (ref 98–111)
Creatinine: 0.94 mg/dL (ref 0.44–1.00)
GFR, Estimated: >60 mL/min (ref >60)
Glucose, Bld: 84 mg/dL (ref 70–99)
Potassium: 3.8 mmol/L (ref 3.5–5.1)
Sodium: 142 mmol/L (ref 135–145)
Total Bilirubin: 0.8 mg/dL (ref 0.3–1.2)
Total Protein: 8.2 g/dL — ABNORMAL HIGH (ref 6.5–8.1)

## 2022-01-11 LAB — CBC WITH DIFFERENTIAL (CANCER CENTER ONLY)
Abs Immature Granulocytes: 0 10*3/uL (ref 0.00–0.07)
Basophils Absolute: 0 10*3/uL (ref 0.0–0.1)
Basophils Relative: 0 %
Eosinophils Absolute: 0 10*3/uL (ref 0.0–0.5)
Eosinophils Relative: 1 %
HCT: 39.7 % (ref 36.0–46.0)
Hemoglobin: 12.4 g/dL (ref 12.0–15.0)
Immature Granulocytes: 0 %
Lymphocytes Relative: 68 %
Lymphs Abs: 3.5 10*3/uL (ref 0.7–4.0)
MCH: 29 pg (ref 26.0–34.0)
MCHC: 31.2 g/dL (ref 30.0–36.0)
MCV: 92.8 fL (ref 80.0–100.0)
Monocytes Absolute: 0.3 10*3/uL (ref 0.1–1.0)
Monocytes Relative: 5 %
Neutro Abs: 1.3 10*3/uL — ABNORMAL LOW (ref 1.7–7.7)
Neutrophils Relative %: 26 %
Platelet Count: 128 10*3/uL — ABNORMAL LOW (ref 150–400)
RBC: 4.28 MIL/uL (ref 3.87–5.11)
RDW: 14.6 % (ref 11.5–15.5)
WBC Count: 5.1 10*3/uL (ref 4.0–10.5)
nRBC: 0 % (ref 0.0–0.2)

## 2022-01-12 LAB — AFP TUMOR MARKER: AFP, Serum, Tumor Marker: 2.1 ng/mL (ref 0.0–9.2)

## 2022-01-13 NOTE — Progress Notes (Signed)
[place]   [phone] Fax:(336) [phone]   Clinic Follow up Note   Patient Care Team: [facility] (Inactive) as PCP - Charissa Bash, MD as Medical Oncologist (Oncology) Alla Feeling, NP as Nurse Practitioner (Oncology) 01/14/2022  CHIEF COMPLAINT: Follow up [place]: Oncology History  Hepatocellular carcinoma ([place])  12/30/2019 Imaging   CT CAP impression [facility]  1.  No convincing evidence of pancreatitis or complications thereof, within the limitations of noncontrast technique.  2.  There is a new 2.2 cm low-density lesion in hepatic segment 7. Given background cirrhosis, this warrants further evaluation with nonemergent hepatic protocol CT or MR.  3.  Patchy areas of consolidation in the lower lungs may be sequela of aspiration or pneumonia.   12/31/2019 Imaging   CT AP with contrast  1.  LI-RADS 4 lesion in segment 7 measuring 2.2 cm as further detailed above.  2.  Incidental note of pulmonary embolus within the right lower lobe, partially imaged.  3.  Cirrhotic morphology of the liver.    07/17/2020 Imaging   CT abdomen with contrast  1.  Compared to 12/31/2019, there has been interval increase in size of the LI-RADS 4 lesion in hepatic segment 7, which now measures 2.9 x 1.9 x 2.3 cm.  2.  Cirrhotic morphology of liver.   07/26/2020 Procedure   EGD -Mild gastritis and gastric antrum -No evidence of varices   09/26/2020 Imaging   MR liver with and without contrast  1.  Redemonstrated LIRADS 4 lesion in the central aspect of segment 7, slightly larger. No other lesions are identified. No adenopathy.  2.  Minimally complex left lower pole cystic lesion, attention on follow up.    10/03/2020 Procedure   Patient underwent TACE and liver biopsy at [facility]   10/03/2020 Initial Biopsy   Specimen A.  Final Diagnosis    A. LIVER, NEEDLE CORE BIOPSY II (SMEAR AND CELL BLOCK):               No definitive evidence of malignancy.               See COMMENT.   Electronically signed by Donzetta Sprung, MD on 10/04/2020 at  2:05 PM  Specimen A.  Adequacy  Satisfactory for evaluation.   Specimen B.  Final Interpretation    B. LIVER, NEEDLE CORE BIOPSY:              No definitive evidence of malignancy.               See COMMENT.    Specimen B.  Diagnosis Comment    The needle core biopsy shows liver parenchyma with well-formed cirrhosis, bile ductular proliferation and macro and microvesicular steatosis. No definitive evidence of hepatocellular carcinoma is identified on the limited core biopsy. Well-formed cirrhosis can could mimic a mass lesion however, an unsampled mass lesion cannot be entirely excluded. Re-biopsy for more diagnostic material is recommended if clinically indicated. The case was reviewed in consultation with Dr. Laurena Spies.      12/24/2020 Imaging   CT AP without contrast  1.  No acute abdominopelvic abnormalities.  2.  Increasing size of treated central posterior right hepatic lobe lesion, evaluation is otherwise limited given lack of intravenous contrast. Consider multiphase CT or MRI with contrast of the abdomen for further evaluation.  3.  Right adnexal cyst with septation and calcification. Cystic tumor can have this appearance. Ultrasound  suggested for further characterization.  4.  Ancillary findings as above.   01/09/2021 Imaging   MRI liver with and without contrast 1.  Continued increase in size of the treated posterior right hepatic lobe lesion favored to reflect posttreatment changes. No enhancement to suggest residual disease. No new lesions.  2.  Partially imaged defect along the posterior aspect of the descending thoracic aorta which may reflect unstable plaque or focal dissection. In retrospect, this was present on the chest CT dated 11/07/2020. Additional area of unstable plaque along the anterior aspect of the infrarenal abdominal aorta. Recommend CTA  chest/abdomen/pelvis and outpatient vascular surgery referral.   02/10/2021 Imaging   CTAP without contrast  1.  No acute findings in the abdomen or pelvis.  2.  Scattered sub-6 mm nodules in the imaged lungs, nonspecific, though may be infectious or inflammatory nature. There is also tree-in-bud nodularity suggesting immaturity/infectious etiologies. Attention on follow-up examinations.  3.  Similar appearance of the treated posterior right hepatic lesion, limited evaluation in the absence of IV contrast. Consider multiphase CT or MRI with contrast of the abdomen for further evaluation as suggested on prior CT.  Similar right adnexal cyst. Ultrasound again suggested for further characterization.  4.  Ancillary  findings as above.   08/02/2021 Imaging   MR abdomen with and without contrast IMPRESSION: Cirrhosis. 5.6 cm treated lesion in the medial right hepatic lobe, without findings to suggest viable tumor.     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 Interval decreased size of treated lesion in the medial right hepatic lobe. No findings to suggest viable tumor. 2. Numerous subcentimeter arterially enhancing lesions are seen throughout the liver which demonstrate no evidence of washout (LI-RADS 3).       CURRENT THERAPY: S/p TACE at [facility] 09/2020, on surveillance   INTERVAL HISTORY: Ms. Rosana Hoes returns for follow up as scheduled. Last seen here 09/10/21. She underwent surveillance lab/MRI last week.  Today she presents in a wheelchair, accompanied by transport from TACE facility.  She lives with her daughter, doing well without changes in her health.  She had feeding tube removed in the last few weeks, eating and drinking better.  She is  more active at home, not bedbound anymore.  Denies abdominal pain/bloating, signs of jaundice, nausea/vomiting, constipation, diarrhea, recent fever, chills, cough, chest pain, dyspnea, or other new concerns.   MEDICAL HISTORY:  Past Medical History:  Diagnosis Date   Hypertension     SURGICAL HISTORY: Past Surgical History:  Procedure Laterality Date   ABDOMINAL HYSTERECTOMY     IR GASTROSTOMY TUBE REMOVAL  11/20/2021    I have reviewed the social history and family history with the patient and they are unchanged from previous note.  ALLERGIES:  has No Known Allergies.  MEDICATIONS:  Current Outpatient Medications  Medication Sig Dispense Refill   apixaban (ELIQUIS) 5 MG TABS tablet Take 5 mg by mouth 2 (two) times daily.     famotidine (PEPCID) 20 MG tablet Take 20 mg by mouth 2 (two) times daily.     magnesium oxide (MAG-OX) 400 MG tablet Take 400 mg by mouth daily.     Metoprolol Tartrate 37.5 MG TABS Take 1 tablet by mouth in the morning and  at bedtime.     sertraline (ZOLOFT) 50 MG tablet Take 50 mg by mouth daily.     simvastatin (ZOCOR) 40 MG tablet Take 40 mg by mouth every evening.     No current facility-administered medications for this visit.    PHYSICAL EXAMINATION: ECOG PERFORMANCE STATUS: 1 - Symptomatic but completely ambulatory  Vitals:   01/14/22 1113  BP: (!) 168/96  Pulse: 69  Resp: 18  SpO2: 99%   Filed Weights    GENERAL:alert, no distress and comfortable SKIN: No rash EYES: sclera clear LUNGS: clear with normal breathing effort HEART: regular rate & rhythm, no lower extremity edema ABDOMEN:abdomen soft, non-tender, nondistended, and normal bowel sounds.  G-tube site healed to left abdomen.  No palpable hepatomegaly or mass NEURO: alert & oriented x 3 with fluent speech  LABORATORY DATA:  I have reviewed the data as listed CBC Latest Ref Rng & Units 01/11/2022 09/10/2021 03/15/2021  WBC 4.0 - 10.5 K/uL 5.1 7.0 5.3  Hemoglobin 12.0 - 15.0  g/dL 12.4 12.3 14.5  Hematocrit 36.0 - 46.0 % 39.7 38.5 44.3  Platelets 150 - 400 K/uL 128(L) 120(L) 143(L)     CMP Latest Ref Rng & Units 01/11/2022 09/10/2021 03/15/2021  Glucose 70 - 99 mg/dL 84 107(H) 103(H)  BUN 8 - 23 mg/dL 9 26(H) 16  Creatinine 0.44 - 1.00 mg/dL 0.94 1.03(H) 0.81  Sodium 135 - 145 mmol/L 142 142 134(L)  Potassium 3.5 - 5.1 mmol/L 3.8 5.0 4.2  Chloride 98 - 111 mmol/L 105 108 100  CO2 22 - 32 mmol/L 30 27 26   Calcium 8.9 - 10.3 mg/dL 9.5 9.9 9.2  Total Protein 6.5 - 8.1 g/dL 8.2(H) 8.3(H) 7.5  Total Bilirubin 0.3 - 1.2 mg/dL 0.8 0.5 0.7  Alkaline Phos 38 - 126 U/L 86 99 104  AST 15 - 41 U/L 27 24 56(H)  ALT 0 - 44 U/L 11 16 25       RADIOGRAPHIC STUDIES: I have personally reviewed the radiological images as listed and agreed with the findings in the report. No results found.   ASSESSMENT & PLAN: 76 yo female    1.Liver lesion LIRADS 4 suspected HCC, cT1bN0M0 stage IB -In the setting of treated hep C and cirrhosis, she had incidental LIRADS 4 liver lesion while hospitalized for COVID in 12/2019 that enlarged in 07/2020.  Staging work-up was negative for metastatic disease. -AFP reportedly normal -She underwent liver biopsy and TACE procedure at [facility] on 10/03/2020, path was not diagnostic for [facility] -Surveillance scans 08/02/2021 showed no evidence of recurrent or metastatic disease -Ms. Drennan appears well. I reviewed normal AFP and MRI from 01/10/22 which shows treated [facility] without evidence of viable tumor, and other LR-3 lesions. No LR-5 lesions. -I have asked radiology to comment on stability of the LR-3 lesions from 07/2021 MRI.  -I recommend to continue surveillance, with AFP (at [facility]) and virtual visit in 3 months, then f/up with MRI in 6 months.  -I reviewed s/sx to call to report, such as increased abdominal pain/bloating, signs of juandice, n/v, unintentional weight loss, or other new concerning changes.  -Case reviewed with Dr. Burr Medico   2. 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 Per pt she did not undergo subsequent routine HCC screening  -she is off lasix and aldactone, no bloating or signs of ascites  -no recent drug or alcohol use -liver function is well compensated,  normal albumin, LFTs and bili  -due to backlog, [facility] was not able to accept her as transfer  -She gets medical care/coverage through [facility]   3. H/o CVA -on Eliquis 5 mg BID, metoprolol, and statin -continue per PCP  PLAN: -Lab, MRI reviewed -Continue surveillance -Lab at [facility] and virtual f/up in 3 months -Lab, MRI in 6 months, with f/up few days after scan -Reviewed s/sx to call to report    Orders Placed This Encounter  Procedures   MR Abdomen W Wo Contrast    Standing Status:   Future    Standing Expiration Date:   01/14/2023    Order Specific Question:   If indicated for the ordered procedure, I authorize the administration of contrast media per Radiology protocol    Answer:   Yes    Order Specific Question:   What is the patient's sedation requirement?    Answer:   No Sedation    Order Specific Question:   Does the patient have a pacemaker or implanted devices?    Answer:   No    Order Specific Question:   Preferred imaging location?    Answer:   [facility] (table limit - 550 lbs)   All questions were answered. The patient knows to call the clinic with any problems, questions or concerns. No barriers to learning was detected. I spent 20 minutes counseling the patient face to face. The total time spent in the appointment was 30 minutes and more than 50% was on counseling and review of test results     Alla Feeling, NP 01/14/22

## 2022-01-14 ENCOUNTER — Other Ambulatory Visit: Payer: Self-pay

## 2022-01-14 ENCOUNTER — Inpatient Hospital Stay (HOSPITAL_BASED_OUTPATIENT_CLINIC_OR_DEPARTMENT_OTHER): Payer: Medicare (Managed Care) | Admitting: Nurse Practitioner

## 2022-01-14 ENCOUNTER — Encounter: Payer: Self-pay | Admitting: Nurse Practitioner

## 2022-01-14 VITALS — BP 168/96 | HR 69 | Resp 18

## 2022-01-14 DIAGNOSIS — C220 Liver cell carcinoma: Secondary | ICD-10-CM | POA: Diagnosis not present

## 2022-01-14 DIAGNOSIS — K769 Liver disease, unspecified: Secondary | ICD-10-CM | POA: Diagnosis not present

## 2022-01-16 ENCOUNTER — Telehealth: Payer: Self-pay | Admitting: Hematology

## 2022-01-16 NOTE — Telephone Encounter (Signed)
Scheduled follow-up appointments per 1/23 los. Leda Gauze from [facility] aware. Mailed calendar.

## 2022-01-21 ENCOUNTER — Other Ambulatory Visit: Payer: Self-pay | Admitting: Family Medicine

## 2022-01-21 ENCOUNTER — Other Ambulatory Visit (HOSPITAL_COMMUNITY): Payer: Self-pay | Admitting: Family Medicine

## 2022-01-21 DIAGNOSIS — Z8619 Personal history of other infectious and parasitic diseases: Secondary | ICD-10-CM

## 2022-01-21 DIAGNOSIS — K7469 Other cirrhosis of liver: Secondary | ICD-10-CM

## 2022-01-21 DIAGNOSIS — C220 Liver cell carcinoma: Secondary | ICD-10-CM

## 2022-03-17 ENCOUNTER — Emergency Department (HOSPITAL_BASED_OUTPATIENT_CLINIC_OR_DEPARTMENT_OTHER)
Admission: EM | Admit: 2022-03-17 | Discharge: 2022-03-18 | Disposition: A | Discharge status: Home / Self Care | Payer: Medicare (Managed Care) | Attending: Emergency Medicine | Admitting: Emergency Medicine

## 2022-03-17 ENCOUNTER — Other Ambulatory Visit: Payer: Self-pay

## 2022-03-17 ENCOUNTER — Encounter (HOSPITAL_BASED_OUTPATIENT_CLINIC_OR_DEPARTMENT_OTHER): Payer: Self-pay

## 2022-03-17 ENCOUNTER — Emergency Department (HOSPITAL_BASED_OUTPATIENT_CLINIC_OR_DEPARTMENT_OTHER): Payer: Medicare (Managed Care)

## 2022-03-17 DIAGNOSIS — R531 Weakness: Secondary | ICD-10-CM | POA: Diagnosis not present

## 2022-03-17 DIAGNOSIS — N3001 Acute cystitis with hematuria: Secondary | ICD-10-CM | POA: Diagnosis not present

## 2022-03-17 DIAGNOSIS — R630 Anorexia: Secondary | ICD-10-CM | POA: Insufficient documentation

## 2022-03-17 DIAGNOSIS — R1013 Epigastric pain: Secondary | ICD-10-CM | POA: Diagnosis not present

## 2022-03-17 DIAGNOSIS — K8020 Calculus of gallbladder without cholecystitis without obstruction: Secondary | ICD-10-CM | POA: Insufficient documentation

## 2022-03-17 DIAGNOSIS — R638 Other symptoms and signs concerning food and fluid intake: Secondary | ICD-10-CM

## 2022-03-17 LAB — HEPATIC FUNCTION PANEL
ALT: 14 U/L (ref 0–44)
AST: 28 U/L (ref 15–41)
Albumin: 4 g/dL (ref 3.5–5.0)
Alkaline Phosphatase: 65 U/L (ref 38–126)
Bilirubin, Direct: 0.2 mg/dL (ref 0.0–0.2)
Indirect Bilirubin: 0.6 mg/dL (ref 0.3–0.9)
Total Bilirubin: 0.8 mg/dL (ref 0.3–1.2)
Total Protein: 8.7 g/dL — ABNORMAL HIGH (ref 6.5–8.1)

## 2022-03-17 LAB — BASIC METABOLIC PANEL
Anion gap: 9 (ref 5–15)
BUN: 11 mg/dL (ref 8–23)
CO2: 26 mmol/L (ref 22–32)
Calcium: 9.8 mg/dL (ref 8.9–10.3)
Chloride: 105 mmol/L (ref 98–111)
Creatinine, Ser: 0.99 mg/dL (ref 0.44–1.00)
GFR, Estimated: 59 mL/min — ABNORMAL LOW (ref >60)
Glucose, Bld: 83 mg/dL (ref 70–99)
Potassium: 4.6 mmol/L (ref 3.5–5.1)
Sodium: 140 mmol/L (ref 135–145)

## 2022-03-17 LAB — URINALYSIS, ROUTINE W REFLEX MICROSCOPIC
Bilirubin Urine: NEGATIVE
Glucose, UA: NEGATIVE mg/dL
Ketones, ur: 15 mg/dL — AB
Nitrite: NEGATIVE
Protein, ur: 30 mg/dL — AB
RBC / HPF: >50 RBC/hpf — ABNORMAL HIGH (ref 0–5)
Specific Gravity, Urine: >1.046 — ABNORMAL HIGH (ref 1.005–1.030)
pH: 7 (ref 5.0–8.0)

## 2022-03-17 LAB — LIPASE, BLOOD: Lipase: 13 U/L (ref 11–51)

## 2022-03-17 LAB — CBC
HCT: 43.7 % (ref 36.0–46.0)
Hemoglobin: 13.7 g/dL (ref 12.0–15.0)
MCH: 28.4 pg (ref 26.0–34.0)
MCHC: 31.4 g/dL (ref 30.0–36.0)
MCV: 90.7 fL (ref 80.0–100.0)
Platelets: 141 10*3/uL — ABNORMAL LOW (ref 150–400)
RBC: 4.82 MIL/uL (ref 3.87–5.11)
RDW: 14.5 % (ref 11.5–15.5)
WBC: 5.7 10*3/uL (ref 4.0–10.5)
nRBC: 0 % (ref 0.0–0.2)

## 2022-03-17 LAB — TROPONIN I (HIGH SENSITIVITY)
Troponin I (High Sensitivity): 21 ng/L — ABNORMAL HIGH (ref <18)
Troponin I (High Sensitivity): 19 ng/L — ABNORMAL HIGH (ref <18)

## 2022-03-17 LAB — MAGNESIUM: Magnesium: 1.9 mg/dL (ref 1.7–2.4)

## 2022-03-17 MED ORDER — IOHEXOL 300 MG/ML  SOLN
100.0000 mL | Freq: Once | INTRAMUSCULAR | Status: AC | PRN
  Administered 2022-03-17: 80 mL via INTRAVENOUS

## 2022-03-17 NOTE — ED Provider Notes (Signed)
?[facility] EMERGENCY DEPT ?Provider Note ? ? ?CSN: [id number] ?Arrival date & time: 03/17/22  1723 ? ?  ? ?History ? ?Chief Complaint  ?Patient presents with  ? anorexia  ? Weakness  ? ? ?Audrey Weber is a 76 y.o. female. ? ?Patient is a 76 year old female presenting with daughter for generalized weakness, malaise, decreased appetite x1 to 2 weeks.  Patient's daughter states she had episode like this approximately 1 year ago when she was was diagnosed with a urinary tract infection.  States she lost so much weight that she got a G-tube placed.  G-tube has been removed for several months.  States patient normally eats very well.  States patient has had generalized abdominal discomfort.  Denies fevers, chills, nausea, vomiting constipation. ? ?The history is provided by the patient. No language interpreter was used.  ?Weakness ?Associated symptoms: abdominal pain   ?Associated symptoms: no arthralgias, no chest pain, no cough, no dysuria, no fever, no seizures, no shortness of breath and no vomiting   ? ?  ? ?Home Medications ?Prior to Admission medications   ?Medication Sig Start Date End Date Taking? Authorizing Provider  ?apixaban (ELIQUIS) 5 MG TABS tablet Take 5 mg by mouth 2 (two) times daily.    [provider]  ?famotidine (PEPCID) 20 MG tablet Take 20 mg by mouth 2 (two) times daily.    [provider]  ?magnesium oxide (MAG-OX) 400 MG tablet Take 400 mg by mouth daily.    [provider]  ?Metoprolol Tartrate 37.5 MG TABS Take 1 tablet by mouth in the morning and at bedtime.    [provider]  ?sertraline (ZOLOFT) 50 MG tablet Take 50 mg by mouth daily.    [provider]  ?simvastatin (ZOCOR) 40 MG tablet Take 40 mg by mouth every evening.    [provider]  ?   ? ?Allergies    ?Patient has no known allergies.   ? ?Review of Systems   ?Review of Systems  ?Constitutional:  Negative for chills and fever.  ?HENT:  Negative for ear pain and  sore throat.   ?Eyes:  Negative for pain and visual disturbance.  ?Respiratory:  Negative for cough and shortness of breath.   ?Cardiovascular:  Negative for chest pain and palpitations.  ?Gastrointestinal:  Positive for abdominal pain. Negative for vomiting.  ?Genitourinary:  Negative for dysuria and hematuria.  ?Musculoskeletal:  Negative for arthralgias and back pain.  ?Skin:  Negative for color change and rash.  ?Neurological:  Positive for weakness. Negative for seizures and syncope.  ?All other systems reviewed and are negative. ? ?Physical Exam ?Updated Vital Signs ?BP (!) 165/112   Pulse 92   Temp 98.1 ?F (36.7 ?C) (Oral)   Resp 17   Ht '5\' 5"'$  (1.651 m)   SpO2 96%   BMI 32.77 kg/m?  ?Physical Exam ?Vitals and nursing note reviewed.  ?Constitutional:   ?   General: She is not in acute distress. ?   Appearance: She is well-developed.  ?HENT:  ?   Head: Normocephalic and atraumatic.  ?Eyes:  ?   Conjunctiva/sclera: Conjunctivae normal.  ?Cardiovascular:  ?   Rate and Rhythm: Normal rate and regular rhythm.  ?   Heart sounds: No murmur heard. ?Pulmonary:  ?   Effort: Pulmonary effort is normal. No respiratory distress.  ?   Breath sounds: Normal breath sounds.  ?Abdominal:  ?   Palpations: Abdomen is soft.  ?   Tenderness: There is abdominal tenderness  in the epigastric area.  ?Musculoskeletal:     ?   General: No swelling.  ?   Cervical back: Neck supple.  ?Skin: ?   General: Skin is warm and dry.  ?   Capillary Refill: Capillary refill takes less than 2 seconds.  ?Neurological:  ?   Mental Status: She is alert.  ?Psychiatric:     ?   Mood and Affect: Mood normal.  ? ? ?ED Results / Procedures / Treatments   ?Labs ?(all labs ordered are listed, but only abnormal results are displayed) ?Labs Reviewed  ?BASIC METABOLIC PANEL - Abnormal; Notable for the following components:  ?    Result Value  ? GFR, Estimated 59 (*)   ? All other components within normal limits  ?CBC - Abnormal; Notable for the following  components:  ? Platelets 141 (*)   ? All other components within normal limits  ?URINALYSIS, ROUTINE W REFLEX MICROSCOPIC  ?TSH  ?MAGNESIUM  ?HEPATIC FUNCTION PANEL  ?LIPASE, BLOOD  ?TROPONIN I (HIGH SENSITIVITY)  ? ? ?EKG ?None ? ?Radiology ?DG Chest Portable 1 View ? ?Result Date: 03/17/2022 ?CLINICAL DATA:  Decreased appetite with general weakness and malaise x1 week. EXAM: PORTABLE CHEST 1 VIEW COMPARISON:  March 15, 2021 FINDINGS: The heart size and mediastinal contours are within normal limits. There is moderate severity calcification of the aortic arch with marked severity tortuosity of the descending thoracic aorta. A trace amount of atelectasis is seen within the lateral aspect of the left lung base. Both lungs are otherwise clear. The visualized skeletal structures are unremarkable. IMPRESSION: Trace amount of left basilar atelectasis. Electronically Signed   By: Virgina Norfolk M.D.   On: 03/17/2022 20:34   ? ?Procedures ?Procedures  ? ? ?Medications Ordered in ED ?Medications - No data to display ? ?ED Course/ Medical Decision Making/ A&P ?  ?                        ?Medical Decision Making ?Amount and/or Complexity of Data Reviewed ?Labs: ordered. ?Radiology: ordered. ? ?Risk ?Prescription drug management. ? ? ?30:47 PM ?76 year old female presenting with daughter for generalized weakness, malaise, decreased appetite x1 to 2 weeks.  Patient is alert and oriented x3, no acute distress, afebrile, hypertension, with stable vitals.  She has epigastric abdominal pain without guarding or rebound on exam. ? ?CT demonstrates demonstrates cholelithiasis without cholecystitis. Stable liver profile and bilirubins. Patient recommended for diet modifications and f/u with primary care for referral to general surgery for elective surgery.   ? ?ECG stable with no ST segment elevation or depression. Troponin stable. UA pending. ? ?Patient in no distress and overall condition improved here in the ED. Detailed discussions  were had with the patient regarding current findings, and need for close f/u with PCP or on call doctor. The patient has been instructed to return immediately if the symptoms worsen in any way for re-evaluation. Patient verbalized understanding and is in agreement with current care plan. All questions answered prior to discharge. ? ?Urine positive for UTI with hematuria and dehydration. Antibiotics sent to pharmacy. Patient encouraged to drink water and follow up with pcp for recheck of urine for resolution of hematuria and to discuss replacement of G-tube if wanted.  ? ? ? ? ? ? ? ?Final Clinical Impression(s) / ED Diagnoses ?Final diagnoses:  ?Decreased oral intake  ?Calculus of gallbladder without cholecystitis without obstruction  ?Acute cystitis with hematuria  ? ? ?Rx / DC  Orders ?ED Discharge Orders   ? ? None  ? ?  ? ? ?  ?Lianne Cure, DO ?43/83/81 1025 ? ?

## 2022-03-17 NOTE — ED Notes (Signed)
Called [facility] at 116 for transportation, two others ahead of patient ?

## 2022-03-17 NOTE — Discharge Instructions (Signed)
Avoid high-fat foods, such as: ?Chocolate, whole milk, ice cream, processed cheese, and egg yolks. ?Fried, deep fried, or buttered foods. ?Sausage, salami, and bacon. ?Cinnamon rolls, cakes, pies, cookies, and other pastries. ?Prepared snack foods, such as potato chips, nut and granola bars, and mixed nuts. ? ?

## 2022-03-17 NOTE — ED Triage Notes (Signed)
Pt. C/o marked decrease in appetite plus a general weakness/malaise x 1 week. Her daughter is here with her and tells [place] that pt. Had had a P.E.G. tube for a time, which was removed ~2 months ago. She required the tube r/t U.T.I. which affected and weakened pt. Profoundly. Pt. Is awake and alert. She is able to tell us her name and d.o.b. and the name of her daughter. She is oriented to month but not date. Her speech is clear. Cbg per EMS was 124. ?

## 2022-03-18 LAB — TSH: TSH: 1.079 u[IU]/mL (ref 0.350–4.500)

## 2022-03-19 ENCOUNTER — Telehealth (HOSPITAL_BASED_OUTPATIENT_CLINIC_OR_DEPARTMENT_OTHER): Payer: Self-pay | Admitting: Emergency Medicine

## 2022-03-19 MED ORDER — CEPHALEXIN 500 MG PO CAPS: 500.0000 mg | ORAL_CAPSULE | Freq: Three times a day (TID) | ORAL | 0 refills | Status: AC

## 2022-03-19 NOTE — Telephone Encounter (Signed)
UA positive for UTI. Prescription for Keflex sent to preferred [facility]. Patient and family notified.  ? ?Dr. Campbell Stall  ?83/35/82 ?10:27 AM ? ?

## 2022-04-13 NOTE — Progress Notes (Signed)
?Audrey Weber   ?[phone] [phone]   ?Clinic Follow up Note  ? ?Patient Care Team: ?Inc, [place] as PCP - General ?Truitt Merle, MD as Medical Oncologist (Oncology) ?Alla Feeling, NP as Nurse Practitioner (Oncology) ?04/15/2022 ? ? ?I connected with  Audrey Weber on 04/15/22 by a phone and verified that I am speaking with the correct person using two identifiers. ?  ?I discussed the limitations of evaluation and management by telemedicine. The patient expressed understanding and agreed to proceed.  ? ?Others in attendance on today's phone call: daughter, Dewaine Oats ? ?Patient's location: Home, lives with daughter ?Provider's location: [facility] office  ? ?CHIEF COMPLAINT: Follow up [place] ? 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 CT CAP impression [facility]  ?1.  No convincing evidence of pancreatitis or complications thereof, within the limitations of noncontrast technique.  ?2.  There is a new 2.2 cm low-density lesion in hepatic segment 7. Given background cirrhosis, this warrants further evaluation with nonemergent hepatic protocol CT or MR.  ?3.  Patchy areas of consolidation in the lower lungs may be sequela of aspiration or pneumonia. ?  ?12/31/2019 Imaging  ? CT AP with contrast  ?1.  LI-RADS 4 lesion in segment 7 measuring 2.2 cm as further detailed above.  ?2.  Incidental note of pulmonary embolus within the right lower lobe, partially imaged.  ?3.  Cirrhotic morphology of the liver.  ?  ?07/17/2020 Imaging  ? CT abdomen with contrast  ?1.  Compared to 12/31/2019, there has been interval increase in size of the LI-RADS 4 lesion in hepatic segment 7, which now measures 2.9 x 1.9 x 2.3 cm.  ?2.  Cirrhotic morphology of liver. ?  ?07/26/2020 Procedure  ? EGD ?-Mild gastritis and gastric antrum ?-No evidence of varices ?  ?09/26/2020 Imaging  ? MR liver with and without contrast  ?1.   Redemonstrated LIRADS 4 lesion in the central aspect of segment 7, slightly larger. No other lesions are identified. No adenopathy.  ?2.  Minimally complex left lower pole cystic lesion, attention on follow up.  ?  ?10/03/2020 Procedure  ? Patient underwent TACE and liver biopsy at [facility] ?  ?10/03/2020 Initial Biopsy  ? Specimen A.  Final Diagnosis    ?A. LIVER, NEEDLE CORE BIOPSY II (SMEAR AND CELL BLOCK): ?             No definitive evidence of malignancy.  ?             See COMMENT.   ?Electronically signed by Donzetta Sprung, MD on 10/04/2020 at  2:05 PM  ?Specimen A.  Adequacy  Satisfactory for evaluation.   ?Specimen B.  Final Interpretation    ?B. LIVER, NEEDLE CORE BIOPSY: ?             No definitive evidence of malignancy.  ?             See COMMENT.   ? ?Specimen B.  Diagnosis Comment    ?The needle core biopsy shows liver parenchyma with well-formed cirrhosis, bile ductular proliferation and macro and microvesicular steatosis. No definitive evidence of hepatocellular carcinoma is identified on the limited core biopsy. Well-formed cirrhosis can could mimic a mass lesion however, an unsampled mass lesion cannot be entirely excluded. Re-biopsy for more diagnostic material is recommended if clinically indicated. The case was reviewed in consultation with Dr. Laurena Spies.   ? ?  ?  12/24/2020 Imaging  ? CT AP without contrast  ?1.  No acute abdominopelvic abnormalities.  ?2.  Increasing size of treated central posterior right hepatic lobe lesion, evaluation is otherwise limited given lack of intravenous contrast. Consider multiphase CT or MRI with contrast of the abdomen for further evaluation.  ?3.  Right adnexal cyst with septation and calcification. Cystic tumor can have this appearance. Ultrasound suggested for further characterization.  ?4.  Ancillary findings as above. ?  ?01/09/2021 Imaging  ? MRI liver with and without contrast ?1.  Continued increase in size of the treated posterior right hepatic lobe  lesion favored to reflect posttreatment changes. No enhancement to suggest residual disease. No new lesions.  ?2.  Partially imaged defect along the posterior aspect of the descending thoracic aorta which may reflect unstable plaque or focal dissection. In retrospect, this was present on the chest CT dated 11/07/2020. Additional area of unstable plaque along the anterior aspect of the infrarenal abdominal aorta. Recommend CTA chest/abdomen/pelvis and outpatient vascular surgery referral. ?  ?02/10/2021 Imaging  ? CTAP without contrast  ?1.  No acute findings in the abdomen or pelvis.  ?2.  Scattered sub-6 mm nodules in the imaged lungs, nonspecific, though may be infectious or inflammatory nature. There is also tree-in-bud nodularity suggesting immaturity/infectious etiologies. Attention on follow-up examinations.  ?3.  Similar appearance of the treated posterior right hepatic lesion, limited evaluation in the absence of IV contrast. Consider multiphase CT or MRI with contrast of the abdomen for further evaluation as suggested on prior CT.  ?Similar right adnexal cyst. Ultrasound again suggested for further characterization.  ?4.  Ancillary  findings as above. ?  ?08/02/2021 Imaging  ? MR abdomen with and without contrast ?IMPRESSION: ?Cirrhosis. 5.6 cm treated lesion in the medial right hepatic lobe, ?without findings to suggest viable tumor. ?  ?  ?09/10/2021 Initial Diagnosis  ? 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 ?  ?09/10/2021 Cancer Staging  ? Staging form: Liver, AJCC 8th Edition ?- Clinical stage from 09/10/2021: Stage IB (cT1b, cN0, cM0) - Signed by Alla Feeling, NP on 09/10/2021 ? ?  ?01/10/2022 Imaging  ? MR abd w/wo contrast IMPRESSION: ?1. Interval decreased size of treated lesion in the medial right hepatic lobe. No findings to suggest viable tumor. ?2. Numerous subcentimeter arterially enhancing lesions are seen throughout the liver which demonstrate no evidence of washout (LI-RADS 3). ?  ?  ?03/17/2022  Imaging  ? CT AP w contrast (done in ED 03/17/22) IMPRESSION: ?Treated lesion within the medial aspect of the right lobe of the liver in the dome of the liver consistent with the given clinical history. This is stable from previous MRI. ?  ?Cholelithiasis without complicating factors. ?  ?Mucosal enhancement within the posterior aspect of the bladder likely related to the decompressed state. No discrete mass is noted. ?  ? ? ?CURRENT THERAPY: S/p TACE at [facility] 09/2020, on surveillance  ?  ?INTERVAL HISTORY: Ms.Seeney and daughter, Dewaine Oats, present by phone for virtual f/up as scheduled. Last seen by me 01/14/22. She continues surveillance. She presented to [place] ED 03/17/22 for generalized weakness, malaise, and anorexia x2 weeks. CT AP w contrast in the ED showed cholelithiasis and stable (treated) R lobe lesion, otherwise negative and without progressive HCC. She was treated for UTI which has resolved. She is eating much better now.  Activity level remains at baseline.  Denies abdominal pain/bloating or signs of jaundice.  She had a little cough lately that resolved with cough medicine.  Denies  bruising/bleeding on Eliquis or signs of thrombosis.  She continues to live with her daughter and go to [place]. ? ?All other systems were reviewed with the patient and are negative. ? ?MEDICAL HISTORY:  ?Past Medical History:  ?Diagnosis Date  ? Hypertension   ? ? ?SURGICAL HISTORY: ?Past Surgical History:  ?Procedure Laterality Date  ? ABDOMINAL HYSTERECTOMY    ? IR GASTROSTOMY TUBE REMOVAL  11/20/2021  ? ? ?I have reviewed the social history and family history with the patient and they are unchanged from previous note. ? ?ALLERGIES:  has No Known Allergies. ? ?MEDICATIONS:  ?Current Outpatient Medications  ?Medication Sig Dispense Refill  ? apixaban (ELIQUIS) 5 MG TABS tablet Take 5 mg by mouth 2 (two) times daily.    ? famotidine (PEPCID) 20 MG tablet Take 20 mg by mouth 2 (two) times daily.    ? Metoprolol  Tartrate 37.5 MG TABS Take 1 tablet by mouth in the morning and at bedtime.    ? sertraline (ZOLOFT) 50 MG tablet Take 50 mg by mouth daily.    ? simvastatin (ZOCOR) 40 MG tablet Take 40 mg by mouth every evening.

## 2022-04-15 ENCOUNTER — Encounter: Payer: Self-pay | Admitting: Nurse Practitioner

## 2022-04-15 ENCOUNTER — Inpatient Hospital Stay: Payer: Medicare (Managed Care) | Attending: Nurse Practitioner | Admitting: Nurse Practitioner

## 2022-04-15 DIAGNOSIS — C220 Liver cell carcinoma: Secondary | ICD-10-CM

## 2022-04-15 NOTE — Progress Notes (Signed)
This nurse reached out to [facility] at provider request to see if an AFP lab has been drawn on this patient recently.  Left a message for the clinic manager to return call to this nurse.  No further concerns at this time.   ?

## 2022-07-08 ENCOUNTER — Other Ambulatory Visit (HOSPITAL_COMMUNITY): Payer: Self-pay | Admitting: Family Medicine

## 2022-07-08 ENCOUNTER — Other Ambulatory Visit: Payer: Self-pay

## 2022-07-08 ENCOUNTER — Inpatient Hospital Stay: Payer: Medicare (Managed Care) | Attending: Nurse Practitioner

## 2022-07-08 ENCOUNTER — Ambulatory Visit (HOSPITAL_COMMUNITY)
Admission: RE | Admit: 2022-07-08 | Discharge: 2022-07-08 | Disposition: A | Discharge status: Home / Self Care | Payer: Medicare (Managed Care) | Source: Ambulatory Visit | Attending: Family Medicine | Admitting: Family Medicine

## 2022-07-08 DIAGNOSIS — C220 Liver cell carcinoma: Secondary | ICD-10-CM | POA: Insufficient documentation

## 2022-07-08 DIAGNOSIS — K7460 Unspecified cirrhosis of liver: Secondary | ICD-10-CM | POA: Insufficient documentation

## 2022-07-08 DIAGNOSIS — Z86711 Personal history of pulmonary embolism: Secondary | ICD-10-CM | POA: Insufficient documentation

## 2022-07-08 DIAGNOSIS — Z7901 Long term (current) use of anticoagulants: Secondary | ICD-10-CM | POA: Insufficient documentation

## 2022-07-08 DIAGNOSIS — K7469 Other cirrhosis of liver: Secondary | ICD-10-CM

## 2022-07-08 DIAGNOSIS — Z8619 Personal history of other infectious and parasitic diseases: Secondary | ICD-10-CM | POA: Insufficient documentation

## 2022-07-08 DIAGNOSIS — Z79899 Other long term (current) drug therapy: Secondary | ICD-10-CM | POA: Insufficient documentation

## 2022-07-08 LAB — CMP (CANCER CENTER ONLY)
ALT: 10 U/L (ref 0–44)
AST: 20 U/L (ref 15–41)
Albumin: 4 g/dL (ref 3.5–5.0)
Alkaline Phosphatase: 114 U/L (ref 38–126)
Anion gap: 4 — ABNORMAL LOW (ref 5–15)
BUN: 17 mg/dL (ref 8–23)
CO2: 30 mmol/L (ref 22–32)
Calcium: 9.7 mg/dL (ref 8.9–10.3)
Chloride: 109 mmol/L (ref 98–111)
Creatinine: 0.97 mg/dL (ref 0.44–1.00)
GFR, Estimated: 60 mL/min — ABNORMAL LOW (ref >60)
Glucose, Bld: 102 mg/dL — ABNORMAL HIGH (ref 70–99)
Potassium: 4.3 mmol/L (ref 3.5–5.1)
Sodium: 143 mmol/L (ref 135–145)
Total Bilirubin: 0.5 mg/dL (ref 0.3–1.2)
Total Protein: 8.3 g/dL — ABNORMAL HIGH (ref 6.5–8.1)

## 2022-07-08 LAB — CBC WITH DIFFERENTIAL (CANCER CENTER ONLY)
Abs Immature Granulocytes: 0 10*3/uL (ref 0.00–0.07)
Basophils Absolute: 0 10*3/uL (ref 0.0–0.1)
Basophils Relative: 0 %
Eosinophils Absolute: 0 10*3/uL (ref 0.0–0.5)
Eosinophils Relative: 1 %
HCT: 37.9 % (ref 36.0–46.0)
Hemoglobin: 12.1 g/dL (ref 12.0–15.0)
Immature Granulocytes: 0 %
Lymphocytes Relative: 70 %
Lymphs Abs: 4.4 10*3/uL — ABNORMAL HIGH (ref 0.7–4.0)
MCH: 29.6 pg (ref 26.0–34.0)
MCHC: 31.9 g/dL (ref 30.0–36.0)
MCV: 92.7 fL (ref 80.0–100.0)
Monocytes Absolute: 0.3 10*3/uL (ref 0.1–1.0)
Monocytes Relative: 5 %
Neutro Abs: 1.5 10*3/uL — ABNORMAL LOW (ref 1.7–7.7)
Neutrophils Relative %: 24 %
Platelet Count: 127 10*3/uL — ABNORMAL LOW (ref 150–400)
RBC: 4.09 MIL/uL (ref 3.87–5.11)
RDW: 16 % — ABNORMAL HIGH (ref 11.5–15.5)
WBC Count: 6.2 10*3/uL (ref 4.0–10.5)
nRBC: 0 % (ref 0.0–0.2)

## 2022-07-10 LAB — AFP TUMOR MARKER: AFP, Serum, Tumor Marker: 2.5 ng/mL (ref 0.0–9.2)

## 2022-07-15 ENCOUNTER — Other Ambulatory Visit: Payer: Medicare (Managed Care)

## 2022-07-18 ENCOUNTER — Inpatient Hospital Stay (HOSPITAL_BASED_OUTPATIENT_CLINIC_OR_DEPARTMENT_OTHER): Payer: Medicare (Managed Care) | Admitting: Hematology

## 2022-07-18 ENCOUNTER — Other Ambulatory Visit: Payer: Self-pay

## 2022-07-18 ENCOUNTER — Encounter: Payer: Self-pay | Admitting: Hematology

## 2022-07-18 VITALS — BP 149/85 | HR 65 | Temp 98.5°F | Resp 18 | Ht 65.0 in | Wt 194.9 lb

## 2022-07-18 DIAGNOSIS — C220 Liver cell carcinoma: Secondary | ICD-10-CM | POA: Diagnosis not present

## 2022-07-18 NOTE — Progress Notes (Signed)
[place]   Telephone:([phone] Fax:(336) [phone]   Clinic Follow up Note   Patient Care Team: Inc, [place] as PCP - Charissa Bash, MD as Medical Oncologist (Oncology) Alla Feeling, NP as Nurse Practitioner (Oncology)  Date of Service:  07/18/2022  CHIEF COMPLAINT: f/u of [place]  CURRENT THERAPY:  Surveillance  ASSESSMENT & PLAN:  Audrey Weber is a 76 y.o. female with   1. Liver lesion LIRADS 4 suspected HCC, cT1bN0M0 stage IB -In the setting of treated hep C and cirrhosis, she had incidental LIRADS 4 liver lesion while hospitalized for COVID in 12/2019 that enlarged in 07/2020.  Staging work-up was negative for metastatic disease. -AFP reportedly normal -She underwent liver biopsy and TACE procedure at [facility] on 10/03/20, path was not diagnostic for [facility]. She is now on surveillance. -Surveillance MRI 07/08/22 (w/o contrast due to pt's difficult veins access) showed no change in treated liver lesion, no obvious new lesions. AFP same day was normal. I reviewed the results with them. I also discussed the limitation of the scan without contrast, but pt is reluctant to consider iv contrast given difficulty. She is clinically doing well and symptomatic, so we will continue to monitor. -she is clinically doing well. Labs from 7/17 reviewed, overall stable. Physical exam was unremarkable (within limitation of pt being in wheelchair). There is no clinical concern for recurrence. -if MRI is not feasible for disease monitoring, will do liver US every 6 months then    2. 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 Per pt she did not undergo subsequent routine HCC screening  -no recent drug or alcohol use -liver function is well compensated, normal albumin, LFTs and bili  -She gets medical care/coverage through [facility]   3. H/o CVA -on Eliquis 5 mg BID, metoprolol, and  statin -continue per PCP    PLAN: -lab and f/u in 6 months with liver MRI w wo contrast a few days before, she agrees to try MRI  -I spoke with her daughter on the phone today    No problem-specific Assessment & Plan notes found for this encounter.   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  No convincing evidence of pancreatitis or complications thereof, within the limitations of noncontrast technique.  2.  There is a new 2.2 cm low-density lesion in hepatic segment 7. Given background cirrhosis, this warrants further evaluation with nonemergent hepatic protocol CT or MR.  3.  Patchy areas of consolidation in the lower lungs may be sequela of aspiration or pneumonia.   12/31/2019 Imaging   CT AP with contrast  1.  LI-RADS 4 lesion in segment 7 measuring 2.2 cm as further detailed above.  2.  Incidental note of pulmonary embolus within the right lower lobe, partially imaged.  3.  Cirrhotic morphology of the liver.    07/17/2020 Imaging   CT abdomen with contrast  1.  Compared to 12/31/2019, there has been interval increase in size of the LI-RADS 4 lesion in hepatic segment 7, which now measures 2.9 x 1.9 x 2.3 cm.  2.  Cirrhotic morphology of liver.   07/26/2020 Procedure   EGD -Mild gastritis and gastric antrum -No evidence of varices   09/26/2020 Imaging   MR liver with and without contrast  1.  Redemonstrated LIRADS 4 lesion in the central aspect of segment 7, slightly larger. No other lesions are identified. No adenopathy.  2.  Minimally complex left lower pole cystic lesion, attention on follow up.    10/03/2020 Procedure   Patient underwent TACE and liver biopsy at [facility]   10/03/2020 Initial Biopsy   Specimen A.  Final Diagnosis    A. LIVER, NEEDLE CORE BIOPSY II (SMEAR AND CELL BLOCK):              No definitive evidence of malignancy.               See COMMENT.    Electronically signed by Donzetta Sprung, MD on 10/04/2020 at  2:05 PM  Specimen A.  Adequacy  Satisfactory for evaluation.   Specimen B.  Final Interpretation    B. LIVER, NEEDLE CORE BIOPSY:              No definitive evidence of malignancy.               See COMMENT.    Specimen B.  Diagnosis Comment    The needle core biopsy shows liver parenchyma with well-formed cirrhosis, bile ductular proliferation and macro and microvesicular steatosis. No definitive evidence of hepatocellular carcinoma is identified on the limited core biopsy. Well-formed cirrhosis can could mimic a mass lesion however, an unsampled mass lesion cannot be entirely excluded. Re-biopsy for more diagnostic material is recommended if clinically indicated. The case was reviewed in consultation with Dr. Laurena Spies.      12/24/2020 Imaging   CT AP without contrast  1.  No acute abdominopelvic abnormalities.  2.  Increasing size of treated central posterior right hepatic lobe lesion, evaluation is otherwise limited given lack of intravenous contrast. Consider multiphase CT or MRI with contrast of the abdomen for further evaluation.  3.  Right adnexal cyst with septation and calcification. Cystic tumor can have this appearance. Ultrasound suggested for further characterization.  4.  Ancillary findings as above.   01/09/2021 Imaging   MRI liver with and without contrast 1.  Continued increase in size of the treated posterior right hepatic lobe lesion favored to reflect posttreatment changes. No enhancement to suggest residual disease. No new lesions.  2.  Partially imaged defect along the posterior aspect of the descending thoracic aorta which may reflect unstable plaque or focal dissection. In retrospect, this was present on the chest CT dated 11/07/2020. Additional area of unstable plaque along the anterior aspect of the infrarenal abdominal aorta. Recommend CTA chest/abdomen/pelvis and outpatient vascular surgery referral.    02/10/2021 Imaging   CTAP without contrast  1.  No acute findings in the abdomen or pelvis.  2.  Scattered sub-6 mm nodules in the imaged lungs, nonspecific, though may be infectious or inflammatory nature. There is also tree-in-bud nodularity suggesting immaturity/infectious etiologies. Attention on follow-up examinations.  3.  Similar appearance of the treated posterior right hepatic lesion, limited evaluation in the absence of IV contrast. Consider multiphase CT or MRI with contrast of the abdomen for further evaluation as suggested on prior CT.  Similar right adnexal cyst. Ultrasound again suggested for further characterization.  4.  Ancillary  findings as above.   08/02/2021 Imaging   MR abdomen with and without contrast IMPRESSION: Cirrhosis. 5.6 cm treated lesion in the medial right hepatic lobe, without findings to suggest viable tumor.     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 Interval decreased size of treated lesion in the medial right hepatic lobe. No findings to suggest viable tumor. 2. Numerous subcentimeter arterially enhancing lesions are seen throughout the liver which demonstrate no evidence of washout (LI-RADS 3).     03/17/2022 Imaging   CT AP w contrast (done in ED 03/17/22) IMPRESSION: Treated lesion within the medial aspect of the right lobe of the liver in the dome of the liver consistent with the given clinical history. This is stable from previous MRI.   Cholelithiasis without complicating factors.   Mucosal enhancement within the posterior aspect of the bladder likely related to the decompressed state. No discrete mass is noted.      INTERVAL HISTORY:  Audrey Weber is here for a follow up of [place]. She was  last seen by NP Lacie on 04/15/22. She presents to the clinic accompanied by a caregiver with [employer], who connected [place] with her daughter via phone. She notes she lives with her daughter now. Her caregiver tells me she ambulates some but requires help to keep her steady. She has no issues with her appetite.   All other systems were reviewed with the patient and are negative.  MEDICAL HISTORY:  Past Medical History:  Diagnosis Date   Hypertension     SURGICAL HISTORY: Past Surgical History:  Procedure Laterality Date   ABDOMINAL HYSTERECTOMY     IR GASTROSTOMY TUBE REMOVAL  11/20/2021    I have reviewed the social history and family history with the patient and they are unchanged from previous note.  ALLERGIES:  has No Known Allergies.  MEDICATIONS:  Current Outpatient Medications  Medication Sig Dispense Refill   apixaban (ELIQUIS) 5 MG TABS tablet Take 5 mg by mouth 2 (two) times daily.     famotidine (PEPCID) 20 MG tablet Take 20 mg by mouth 2 (two) times daily.     magnesium oxide (MAG-OX) 400 MG tablet Take 400 mg by mouth daily.     Metoprolol Tartrate 37.5 MG TABS Take 1 tablet by mouth in the morning and at bedtime.     sertraline (ZOLOFT) 50 MG tablet Take 50 mg by mouth daily.     simvastatin (ZOCOR) 40 MG tablet Take 40 mg by mouth every evening.     No current facility-administered medications for this visit.    PHYSICAL EXAMINATION: ECOG PERFORMANCE STATUS: 2 - Symptomatic, <50% confined to bed  Vitals:   07/18/22 1108  BP: (!) 149/85  Pulse: 65  Resp: 18  Temp: 98.5 F (36.9 C)  SpO2: 100%   Wt Readings from Last 3 Encounters:  07/18/22 194 lb 14.4 oz (88.4 kg)  09/10/21 196 lb 14.4 oz (89.3 kg)  03/21/20 224 lb (101.6 kg)     GENERAL:alert, no distress and comfortable SKIN: skin color, texture, turgor are normal, no rashes or significant lesions EYES: normal, Conjunctiva are pink and non-injected, sclera clear  LUNGS: clear to auscultation and  percussion with normal breathing effort HEART: regular rate & rhythm and no murmurs and no lower extremity edema ABDOMEN:abdomen soft, non-tender and normal bowel sounds NEURO: alert & oriented x 3 with fluent speech, no focal motor/sensory deficits  LABORATORY DATA:  I have reviewed the data as listed    Latest Ref Rng & Units 07/08/2022   11:31 AM 03/17/2022  7:18 PM 01/11/2022   11:06 AM  CBC  WBC 4.0 - 10.5 K/uL 6.2  5.7  5.1   Hemoglobin 12.0 - 15.0 g/dL 12.1  13.7  12.4   Hematocrit 36.0 - 46.0 % 37.9  43.7  39.7   Platelets 150 - 400 K/uL 127  141  128         Latest Ref Rng & Units 07/08/2022   11:31 AM 03/17/2022    8:39 PM 03/17/2022    7:18 PM  CMP  Glucose 70 - 99 mg/dL 102   83   BUN 8 - 23 mg/dL 17   11   Creatinine 0.44 - 1.00 mg/dL 0.97   0.99   Sodium 135 - 145 mmol/L 143   140   Potassium 3.5 - 5.1 mmol/L 4.3   4.6   Chloride 98 - 111 mmol/L 109   105   CO2 22 - 32 mmol/L 30   26   Calcium 8.9 - 10.3 mg/dL 9.7   9.8   Total Protein 6.5 - 8.1 g/dL 8.3  8.7    Total Bilirubin 0.3 - 1.2 mg/dL 0.5  0.8    Alkaline Phos 38 - 126 U/L 114  65    AST 15 - 41 U/L 20  28    ALT 0 - 44 U/L 10  14        RADIOGRAPHIC STUDIES: I have personally reviewed the radiological images as listed and agreed with the findings in the report. No results found.    Orders Placed This Encounter  Procedures   MR LIVER W WO CONTRAST    Standing Status:   Future    Standing Expiration Date:   07/19/2023    Order Specific Question:   If indicated for the ordered procedure, I authorize the administration of contrast media per Radiology protocol    Answer:   Yes    Order Specific Question:   What is the patient's sedation requirement?    Answer:   No Sedation    Order Specific Question:   Does the patient have a pacemaker or implanted devices?    Answer:   No    Order Specific Question:   Preferred imaging location?    Answer:   [facility] (table limit - 550 lbs)    All questions were answered. The patient knows to call the clinic with any problems, questions or concerns. No barriers to learning was detected. The total time spent in the appointment was 30 minutes.     Truitt Merle, MD 07/18/2022   I, Wilburn Mylar, am acting as scribe for Truitt Merle, MD.   I have reviewed the above documentation for accuracy and completeness, and I agree with the above.

## 2022-07-23 ENCOUNTER — Telehealth: Payer: Self-pay | Admitting: Hematology

## 2022-07-23 NOTE — Telephone Encounter (Signed)
Left message with follow-up appointments per 7/27 los.

## 2022-09-10 ENCOUNTER — Other Ambulatory Visit: Payer: Self-pay

## 2022-09-10 DIAGNOSIS — K7469 Other cirrhosis of liver: Secondary | ICD-10-CM

## 2022-09-10 DIAGNOSIS — C220 Liver cell carcinoma: Secondary | ICD-10-CM

## 2022-09-10 DIAGNOSIS — Z8619 Personal history of other infectious and parasitic diseases: Secondary | ICD-10-CM

## 2022-12-27 ENCOUNTER — Ambulatory Visit
Admission: RE | Admit: 2022-12-27 | Discharge: 2022-12-27 | Disposition: A | Discharge status: Home / Self Care | Payer: Medicare (Managed Care) | Source: Ambulatory Visit

## 2022-12-27 DIAGNOSIS — Z8619 Personal history of other infectious and parasitic diseases: Secondary | ICD-10-CM

## 2022-12-27 DIAGNOSIS — C220 Liver cell carcinoma: Secondary | ICD-10-CM

## 2022-12-27 DIAGNOSIS — K7469 Other cirrhosis of liver: Secondary | ICD-10-CM

## 2022-12-27 MED ORDER — GADOPICLENOL 0.5 MMOL/ML IV SOLN
10.0000 mL | Freq: Once | INTRAVENOUS | Status: AC | PRN
  Administered 2022-12-27: 10 mL via INTRAVENOUS

## 2022-12-27 MED ORDER — GADOPICLENOL 0.5 MMOL/ML IV SOLN: 10.0000 mL | Freq: Once | INTRAVENOUS | Status: DC | PRN

## 2023-01-10 ENCOUNTER — Other Ambulatory Visit: Payer: Self-pay

## 2023-01-10 DIAGNOSIS — C220 Liver cell carcinoma: Secondary | ICD-10-CM

## 2023-01-13 ENCOUNTER — Other Ambulatory Visit: Payer: Medicare (Managed Care)

## 2023-01-15 DIAGNOSIS — K7460 Unspecified cirrhosis of liver: Secondary | ICD-10-CM | POA: Insufficient documentation

## 2023-01-15 NOTE — Assessment & Plan Note (Deleted)
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 Per pt she did not undergo subsequent routine HCC screening  -no recent drug or alcohol use -liver function is well compensated, normal albumin, LFTs and bili  -She gets medical care/coverage through [place] [facility]

## 2023-01-15 NOTE — Assessment & Plan Note (Deleted)
cT1bN0M0 stage IB -In the setting of treated hep C and cirrhosis, she had incidental LIRADS 4 liver lesion while hospitalized for COVID in 12/2019 that enlarged in 07/2020.  Staging work-up was negative for metastatic disease. -AFP reportedly normal -She underwent liver biopsy and TACE procedure at [facility] on 10/03/20, path was not diagnostic for [facility]. She is now on surveillance.

## 2023-01-16 ENCOUNTER — Inpatient Hospital Stay: Payer: Medicare (Managed Care) | Attending: Hematology | Admitting: Hematology

## 2023-01-16 DIAGNOSIS — C220 Liver cell carcinoma: Secondary | ICD-10-CM

## 2023-01-16 DIAGNOSIS — K7469 Other cirrhosis of liver: Secondary | ICD-10-CM

## 2023-01-16 NOTE — Progress Notes (Deleted)
Audrey Weber   Telephone:(336) [phone] Fax:(336) [phone]   Clinic Follow up Note   Patient Care Team: Inc, [place] as PCP - Charissa Bash, MD as Medical Oncologist (Oncology) Alla Feeling, NP as Nurse Practitioner (Oncology)  Date of Service:  01/16/2023  CHIEF COMPLAINT: f/u of [name]   CURRENT THERAPY: Surveillance     ASSESSMENT: *** Audrey Weber is a 77 y.o. female with   No problem-specific Assessment & Plan notes found for this encounter.  ***   PLAN: {Everything Dr. Burr Medico talks to pt about, including reviewing scans and labs. } -{proceed with ***} -{lab with/without flush and f/u when?}   SUMMARY OF ONCOLOGIC HISTORY: Oncology History  Hepatocellular carcinoma ([place])  12/30/2019 Imaging   CT CAP impression [facility]  1.  No convincing evidence of pancreatitis or complications thereof, within the limitations of noncontrast technique.  2.  There is a new 2.2 cm low-density lesion in hepatic segment 7. Given background cirrhosis, this warrants further evaluation with nonemergent hepatic protocol CT or MR.  3.  Patchy areas of consolidation in the lower lungs may be sequela of aspiration or pneumonia.   12/31/2019 Imaging   CT AP with contrast  1.  LI-RADS 4 lesion in segment 7 measuring 2.2 cm as further detailed above.  2.  Incidental note of pulmonary embolus within the right lower lobe, partially imaged.  3.  Cirrhotic morphology of the liver.    07/17/2020 Imaging   CT abdomen with contrast  1.  Compared to 12/31/2019, there has been interval increase in size of the LI-RADS 4 lesion in hepatic segment 7, which now measures 2.9 x 1.9 x 2.3 cm.  2.  Cirrhotic morphology of liver.   07/26/2020 Procedure   EGD -Mild gastritis and gastric antrum -No evidence of varices   09/26/2020 Imaging   MR liver with and without contrast  1.  Redemonstrated LIRADS 4 lesion in the central aspect of segment 7, slightly larger.  No other lesions are identified. No adenopathy.  2.  Minimally complex left lower pole cystic lesion, attention on follow up.    10/03/2020 Procedure   Patient underwent TACE and liver biopsy at [facility]   10/03/2020 Initial Biopsy   Specimen A.  Final Diagnosis    A. LIVER, NEEDLE CORE BIOPSY II (SMEAR AND CELL BLOCK):              No definitive evidence of malignancy.               See COMMENT.   Electronically signed by Donzetta Sprung, MD on 10/04/2020 at  2:05 PM  Specimen A.  Adequacy  Satisfactory for evaluation.   Specimen B.  Final Interpretation    B. LIVER, NEEDLE CORE BIOPSY:              No definitive evidence of malignancy.               See COMMENT.    Specimen B.  Diagnosis Comment    The needle core biopsy shows liver parenchyma with well-formed cirrhosis, bile ductular proliferation and macro and microvesicular steatosis. No definitive evidence of hepatocellular carcinoma is identified on the limited core biopsy. Well-formed cirrhosis can could mimic a mass lesion however, an unsampled mass lesion cannot be entirely excluded. Re-biopsy for more diagnostic material is recommended if clinically indicated. The case was reviewed in consultation with Dr. Laurena Spies.      12/24/2020 Imaging  CT AP without contrast  1.  No acute abdominopelvic abnormalities.  2.  Increasing size of treated central posterior right hepatic lobe lesion, evaluation is otherwise limited given lack of intravenous contrast. Consider multiphase CT or MRI with contrast of the abdomen for further evaluation.  3.  Right adnexal cyst with septation and calcification. Cystic tumor can have this appearance. Ultrasound suggested for further characterization.  4.  Ancillary findings as above.   01/09/2021 Imaging   MRI liver with and without contrast 1.  Continued increase in size of the treated posterior right hepatic lobe lesion favored to reflect posttreatment changes. No enhancement to suggest residual  disease. No new lesions.  2.  Partially imaged defect along the posterior aspect of the descending thoracic aorta which may reflect unstable plaque or focal dissection. In retrospect, this was present on the chest CT dated 11/07/2020. Additional area of unstable plaque along the anterior aspect of the infrarenal abdominal aorta. Recommend CTA chest/abdomen/pelvis and outpatient vascular surgery referral.   02/10/2021 Imaging   CTAP without contrast  1.  No acute findings in the abdomen or pelvis.  2.  Scattered sub-6 mm nodules in the imaged lungs, nonspecific, though may be infectious or inflammatory nature. There is also tree-in-bud nodularity suggesting immaturity/infectious etiologies. Attention on follow-up examinations.  3.  Similar appearance of the treated posterior right hepatic lesion, limited evaluation in the absence of IV contrast. Consider multiphase CT or MRI with contrast of the abdomen for further evaluation as suggested on prior CT.  Similar right adnexal cyst. Ultrasound again suggested for further characterization.  4.  Ancillary  findings as above.   08/02/2021 Imaging   MR abdomen with and without contrast IMPRESSION: Cirrhosis. 5.6 cm treated lesion in the medial right hepatic lobe, without findings to suggest viable tumor.     09/10/2021 Initial Diagnosis   Hepatocellular carcinoma ([place])   09/10/2021 Cancer Staging   Staging form: Liver, AJCC 8th Edition - Clinical stage from 09/10/2021: Stage IB (cT1b, cN0, cM0) - Signed by Alla Feeling, NP on 09/10/2021   01/10/2022 Imaging   MR abd w/wo contrast IMPRESSION: 1. Interval decreased size of treated lesion in the medial right hepatic lobe. No findings to suggest viable tumor. 2. Numerous subcentimeter arterially enhancing lesions are seen throughout the liver which demonstrate no evidence of washout (LI-RADS 3).     03/17/2022 Imaging   CT AP w contrast (done in ED 03/17/22) IMPRESSION: Treated lesion within the  medial aspect of the right lobe of the liver in the dome of the liver consistent with the given clinical history. This is stable from previous MRI.   Cholelithiasis without complicating factors.   Mucosal enhancement within the posterior aspect of the bladder likely related to the decompressed state. No discrete mass is noted.      INTERVAL HISTORY: *** Audrey Weber is here for a follow up of  [facility] She was last seen by me on 07/18/2022 She presents to the clinic     All other systems were reviewed with the patient and are negative.  MEDICAL HISTORY:  Past Medical History:  Diagnosis Date   Hypertension     SURGICAL HISTORY: Past Surgical History:  Procedure Laterality Date   ABDOMINAL HYSTERECTOMY     IR GASTROSTOMY TUBE REMOVAL  11/20/2021    I have reviewed the social history and family history with the patient and they are unchanged from previous note.  ALLERGIES:  has No Known Allergies.  MEDICATIONS:  Current Outpatient  Medications  Medication Sig Dispense Refill   apixaban (ELIQUIS) 5 MG TABS tablet Take 5 mg by mouth 2 (two) times daily.     famotidine (PEPCID) 20 MG tablet Take 20 mg by mouth 2 (two) times daily.     magnesium oxide (MAG-OX) 400 MG tablet Take 400 mg by mouth daily.     Metoprolol Tartrate 37.5 MG TABS Take 1 tablet by mouth in the morning and at bedtime.     sertraline (ZOLOFT) 50 MG tablet Take 50 mg by mouth daily.     simvastatin (ZOCOR) 40 MG tablet Take 40 mg by mouth every evening.     No current facility-administered medications for this visit.    PHYSICAL EXAMINATION: ECOG PERFORMANCE STATUS: {CHL ONC ECOG PS:[id number]}  There were no vitals filed for this visit. Wt Readings from Last 3 Encounters:  07/18/22 194 lb 14.4 oz (88.4 kg)  09/10/21 196 lb 14.4 oz (89.3 kg)  03/21/20 224 lb (101.6 kg)    {Only keep what was examined. If exam not performed, can use .CEXAM } GENERAL:alert, no distress and comfortable SKIN: skin color,  texture, turgor are normal, no rashes or significant lesions EYES: normal, Conjunctiva are pink and non-injected, sclera clear {OROPHARYNX:no exudate, no erythema and lips, buccal mucosa, and tongue normal}  NECK: supple, thyroid normal size, non-tender, without nodularity LYMPH:  no palpable lymphadenopathy in the cervical, axillary {or inguinal} LUNGS: clear to auscultation and percussion with normal breathing effort HEART: regular rate & rhythm and no murmurs and no lower extremity edema ABDOMEN:abdomen soft, non-tender and normal bowel sounds Musculoskeletal:no cyanosis of digits and no clubbing  NEURO: alert & oriented x 3 with fluent speech, no focal motor/sensory deficits  LABORATORY DATA:  I have reviewed the data as listed    Latest Ref Rng & Units 07/08/2022   11:31 AM 03/17/2022    7:18 PM 01/11/2022   11:06 AM  CBC  WBC 4.0 - 10.5 K/uL 6.2  5.7  5.1   Hemoglobin 12.0 - 15.0 g/dL 12.1  13.7  12.4   Hematocrit 36.0 - 46.0 % 37.9  43.7  39.7   Platelets 150 - 400 K/uL 127  141  128         Latest Ref Rng & Units 07/08/2022   11:31 AM 03/17/2022    8:39 PM 03/17/2022    7:18 PM  CMP  Glucose 70 - 99 mg/dL 102   83   BUN 8 - 23 mg/dL 17   11   Creatinine 0.44 - 1.00 mg/dL 0.97   0.99   Sodium 135 - 145 mmol/L 143   140   Potassium 3.5 - 5.1 mmol/L 4.3   4.6   Chloride 98 - 111 mmol/L 109   105   CO2 22 - 32 mmol/L 30   26   Calcium 8.9 - 10.3 mg/dL 9.7   9.8   Total Protein 6.5 - 8.1 g/dL 8.3  8.7    Total Bilirubin 0.3 - 1.2 mg/dL 0.5  0.8    Alkaline Phos 38 - 126 U/L 114  65    AST 15 - 41 U/L 20  28    ALT 0 - 44 U/L 10  14        RADIOGRAPHIC STUDIES: I have personally reviewed the radiological images as listed and agreed with the findings in the report. No results found.    No orders of the defined types were placed in this encounter.  All questions were  answered. The patient knows to call the clinic with any problems, questions or concerns. No barriers  to learning was detected. The total time spent in the appointment was {CHL ONC TIME VISIT - [id number].     Baldemar Friday, CMA 01/16/2023   I, Audry Riles, CMA, am acting as scribe for Truitt Merle, MD.   {Add scribe attestation statement}

## 2023-01-21 ENCOUNTER — Telehealth: Payer: Self-pay | Admitting: Hematology

## 2023-01-21 NOTE — Telephone Encounter (Signed)
Per 1/25 IB, msg left

## 2023-01-31 ENCOUNTER — Inpatient Hospital Stay: Payer: Medicare (Managed Care)

## 2023-01-31 ENCOUNTER — Inpatient Hospital Stay: Payer: Medicare (Managed Care) | Admitting: Hematology

## 2023-01-31 DIAGNOSIS — C220 Liver cell carcinoma: Secondary | ICD-10-CM

## 2023-01-31 NOTE — Progress Notes (Deleted)
[place]   Telephone:(336) [phone] Fax:(336) [phone]   Clinic Follow up Note   Patient Care Team: Inc, [place] as PCP - Charissa Bash, MD as Medical Oncologist (Oncology) Alla Feeling, NP as Nurse Practitioner (Oncology)  Date of Service:  01/31/2023  CHIEF COMPLAINT: f/u of [place]   CURRENT THERAPY:  Surveillance   ASSESSMENT: *** Caylen Vicens is a 77 y.o. female with   No problem-specific Assessment & Plan notes found for this encounter.  ***   PLAN:   SUMMARY OF ONCOLOGIC HISTORY: Oncology History  Hepatocellular carcinoma ([place])  12/30/2019 Imaging   CT CAP impression [facility]  1.  No convincing evidence of pancreatitis or complications thereof, within the limitations of noncontrast technique.  2.  There is a new 2.2 cm low-density lesion in hepatic segment 7. Given background cirrhosis, this warrants further evaluation with nonemergent hepatic protocol CT or MR.  3.  Patchy areas of consolidation in the lower lungs may be sequela of aspiration or pneumonia.   12/31/2019 Imaging   CT AP with contrast  1.  LI-RADS 4 lesion in segment 7 measuring 2.2 cm as further detailed above.  2.  Incidental note of pulmonary embolus within the right lower lobe, partially imaged.  3.  Cirrhotic morphology of the liver.    07/17/2020 Imaging   CT abdomen with contrast  1.  Compared to 12/31/2019, there has been interval increase in size of the LI-RADS 4 lesion in hepatic segment 7, which now measures 2.9 x 1.9 x 2.3 cm.  2.  Cirrhotic morphology of liver.   07/26/2020 Procedure   EGD -Mild gastritis and gastric antrum -No evidence of varices   09/26/2020 Imaging   MR liver with and without contrast  1.  Redemonstrated LIRADS 4 lesion in the central aspect of segment 7, slightly larger. No other lesions are identified. No adenopathy.  2.  Minimally complex left lower pole cystic lesion, attention on follow up.    10/03/2020  Procedure   Patient underwent TACE and liver biopsy at [facility]   10/03/2020 Initial Biopsy   Specimen A.  Final Diagnosis    A. LIVER, NEEDLE CORE BIOPSY II (SMEAR AND CELL BLOCK):              No definitive evidence of malignancy.               See COMMENT.   Electronically signed by Donzetta Sprung, MD on 10/04/2020 at  2:05 PM  Specimen A.  Adequacy  Satisfactory for evaluation.   Specimen B.  Final Interpretation    B. LIVER, NEEDLE CORE BIOPSY:              No definitive evidence of malignancy.               See COMMENT.    Specimen B.  Diagnosis Comment    The needle core biopsy shows liver parenchyma with well-formed cirrhosis, bile ductular proliferation and macro and microvesicular steatosis. No definitive evidence of hepatocellular carcinoma is identified on the limited core biopsy. Well-formed cirrhosis can could mimic a mass lesion however, an unsampled mass lesion cannot be entirely excluded. Re-biopsy for more diagnostic material is recommended if clinically indicated. The case was reviewed in consultation with Dr. Laurena Spies.      12/24/2020 Imaging   CT AP without contrast  1.  No acute abdominopelvic abnormalities.  2.  Increasing size of treated central posterior right  hepatic lobe lesion, evaluation is otherwise limited given lack of intravenous contrast. Consider multiphase CT or MRI with contrast of the abdomen for further evaluation.  3.  Right adnexal cyst with septation and calcification. Cystic tumor can have this appearance. Ultrasound suggested for further characterization.  4.  Ancillary findings as above.   01/09/2021 Imaging   MRI liver with and without contrast 1.  Continued increase in size of the treated posterior right hepatic lobe lesion favored to reflect posttreatment changes. No enhancement to suggest residual disease. No new lesions.  2.  Partially imaged defect along the posterior aspect of the descending thoracic aorta which may reflect unstable  plaque or focal dissection. In retrospect, this was present on the chest CT dated 11/07/2020. Additional area of unstable plaque along the anterior aspect of the infrarenal abdominal aorta. Recommend CTA chest/abdomen/pelvis and outpatient vascular surgery referral.   02/10/2021 Imaging   CTAP without contrast  1.  No acute findings in the abdomen or pelvis.  2.  Scattered sub-6 mm nodules in the imaged lungs, nonspecific, though may be infectious or inflammatory nature. There is also tree-in-bud nodularity suggesting immaturity/infectious etiologies. Attention on follow-up examinations.  3.  Similar appearance of the treated posterior right hepatic lesion, limited evaluation in the absence of IV contrast. Consider multiphase CT or MRI with contrast of the abdomen for further evaluation as suggested on prior CT.  Similar right adnexal cyst. Ultrasound again suggested for further characterization.  4.  Ancillary  findings as above.   08/02/2021 Imaging   MR abdomen with and without contrast IMPRESSION: Cirrhosis. 5.6 cm treated lesion in the medial right hepatic lobe, without findings to suggest viable tumor.     09/10/2021 Initial Diagnosis   Hepatocellular carcinoma ([place])   09/10/2021 Cancer Staging   Staging form: Liver, AJCC 8th Edition - Clinical stage from 09/10/2021: Stage IB (cT1b, cN0, cM0) - Signed by Alla Feeling, NP on 09/10/2021   01/10/2022 Imaging   MR abd w/wo contrast IMPRESSION: 1. Interval decreased size of treated lesion in the medial right hepatic lobe. No findings to suggest viable tumor. 2. Numerous subcentimeter arterially enhancing lesions are seen throughout the liver which demonstrate no evidence of washout (LI-RADS 3).     03/17/2022 Imaging   CT AP w contrast (done in ED 03/17/22) IMPRESSION: Treated lesion within the medial aspect of the right lobe of the liver in the dome of the liver consistent with the given clinical history. This is stable from previous  MRI.   Cholelithiasis without complicating factors.   Mucosal enhancement within the posterior aspect of the bladder likely related to the decompressed state. No discrete mass is noted.      INTERVAL HISTORY: *** Desmona Sorn is here for a follow up of  [facility] She was last seen by me on 07/18/2022 She presents to the clinic     All other systems were reviewed with the patient and are negative.  MEDICAL HISTORY:  Past Medical History:  Diagnosis Date   Hypertension     SURGICAL HISTORY: Past Surgical History:  Procedure Laterality Date   ABDOMINAL HYSTERECTOMY     IR GASTROSTOMY TUBE REMOVAL  11/20/2021    I have reviewed the social history and family history with the patient and they are unchanged from previous note.  ALLERGIES:  has No Known Allergies.  MEDICATIONS:  Current Outpatient Medications  Medication Sig Dispense Refill   apixaban (ELIQUIS) 5 MG TABS tablet Take 5 mg by mouth 2 (two)  times daily.     famotidine (PEPCID) 20 MG tablet Take 20 mg by mouth 2 (two) times daily.     magnesium oxide (MAG-OX) 400 MG tablet Take 400 mg by mouth daily.     Metoprolol Tartrate 37.5 MG TABS Take 1 tablet by mouth in the morning and at bedtime.     sertraline (ZOLOFT) 50 MG tablet Take 50 mg by mouth daily.     simvastatin (ZOCOR) 40 MG tablet Take 40 mg by mouth every evening.     No current facility-administered medications for this visit.    PHYSICAL EXAMINATION: ECOG PERFORMANCE STATUS: {CHL ONC ECOG PS:[id number]}  There were no vitals filed for this visit. Wt Readings from Last 3 Encounters:  07/18/22 194 lb 14.4 oz (88.4 kg)  09/10/21 196 lb 14.4 oz (89.3 kg)  03/21/20 224 lb (101.6 kg)    {Only keep what was examined. If exam not performed, can use .CEXAM } GENERAL:alert, no distress and comfortable SKIN: skin color, texture, turgor are normal, no rashes or significant lesions EYES: normal, Conjunctiva are pink and non-injected, sclera clear {OROPHARYNX:no  exudate, no erythema and lips, buccal mucosa, and tongue normal}  NECK: supple, thyroid normal size, non-tender, without nodularity LYMPH:  no palpable lymphadenopathy in the cervical, axillary {or inguinal} LUNGS: clear to auscultation and percussion with normal breathing effort HEART: regular rate & rhythm and no murmurs and no lower extremity edema ABDOMEN:abdomen soft, non-tender and normal bowel sounds Musculoskeletal:no cyanosis of digits and no clubbing  NEURO: alert & oriented x 3 with fluent speech, no focal motor/sensory deficits  LABORATORY DATA:  I have reviewed the data as listed    Latest Ref Rng & Units 07/08/2022   11:31 AM 03/17/2022    7:18 PM 01/11/2022   11:06 AM  CBC  WBC 4.0 - 10.5 K/uL 6.2  5.7  5.1   Hemoglobin 12.0 - 15.0 g/dL 12.1  13.7  12.4   Hematocrit 36.0 - 46.0 % 37.9  43.7  39.7   Platelets 150 - 400 K/uL 127  141  128         Latest Ref Rng & Units 07/08/2022   11:31 AM 03/17/2022    8:39 PM 03/17/2022    7:18 PM  CMP  Glucose 70 - 99 mg/dL 102   83   BUN 8 - 23 mg/dL 17   11   Creatinine 0.44 - 1.00 mg/dL 0.97   0.99   Sodium 135 - 145 mmol/L 143   140   Potassium 3.5 - 5.1 mmol/L 4.3   4.6   Chloride 98 - 111 mmol/L 109   105   CO2 22 - 32 mmol/L 30   26   Calcium 8.9 - 10.3 mg/dL 9.7   9.8   Total Protein 6.5 - 8.1 g/dL 8.3  8.7    Total Bilirubin 0.3 - 1.2 mg/dL 0.5  0.8    Alkaline Phos 38 - 126 U/L 114  65    AST 15 - 41 U/L 20  28    ALT 0 - 44 U/L 10  14        RADIOGRAPHIC STUDIES: I have personally reviewed the radiological images as listed and agreed with the findings in the report. No results found.    No orders of the defined types were placed in this encounter.  All questions were answered. The patient knows to call the clinic with any problems, questions or concerns. No barriers to learning was detected. The  total time spent in the appointment was {CHL ONC TIME VISIT - [id number].     Baldemar Friday,  CMA 01/31/2023   I, Audry Riles, CMA, am acting as scribe for Truitt Merle, MD.   {Add scribe attestation statement}

## 2023-01-31 NOTE — Assessment & Plan Note (Deleted)
-  In the setting of treated hep C and cirrhosis, she had incidental LIRADS 4 liver lesion while hospitalized for COVID in 12/2019 that enlarged in 07/2020.  Staging work-up was negative for metastatic disease. -AFP reportedly normal -She underwent liver biopsy and TACE procedure at [facility] on 10/03/20, path was not diagnostic for [facility]. She is now on surveillance. -surveillance MRI 12/26/22 showed grossly similar treated lesion within the high right hepatic lobe, san quality limited due to motion

## 2023-02-03 ENCOUNTER — Telehealth: Payer: Self-pay | Admitting: Hematology

## 2023-02-03 NOTE — Telephone Encounter (Signed)
Scheduled per 2/12 IB rescheduled patient from missed appointment on 01/31/23, Left voicemail

## 2023-02-18 NOTE — Assessment & Plan Note (Deleted)
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 Per pt she did not undergo subsequent routine HCC screening  -no recent drug or alcohol use -liver function is well compensated, normal albumin, LFTs and bili  -She gets medical care/coverage through [place] [facility]

## 2023-02-18 NOTE — Assessment & Plan Note (Deleted)
-  In the setting of treated hep C and cirrhosis, she had incidental LIRADS 4 liver lesion while hospitalized for COVID in 12/2019 that enlarged in 07/2020.  Staging work-up was negative for metastatic disease. -AFP reportedly normal -She underwent liver biopsy and TACE procedure at [facility] on 10/03/20, path was not diagnostic for [facility]. She is now on surveillance. -Surveillance MRI 07/08/22 (w/o contrast due to pt's difficult veins access) showed no change in treated liver lesion, no obvious new lesions -Surveillance MRI with and without contrast on December 27, 2022 showed a stable treated lesion, no new lesions, I discussed the findings with patient.

## 2023-02-19 ENCOUNTER — Inpatient Hospital Stay: Payer: Medicare (Managed Care) | Admitting: Hematology

## 2023-02-19 ENCOUNTER — Inpatient Hospital Stay: Payer: Medicare (Managed Care) | Attending: Hematology

## 2023-02-19 ENCOUNTER — Telehealth: Payer: Self-pay | Admitting: Hematology

## 2023-02-19 DIAGNOSIS — K7469 Other cirrhosis of liver: Secondary | ICD-10-CM

## 2023-02-19 DIAGNOSIS — C220 Liver cell carcinoma: Secondary | ICD-10-CM

## 2023-02-19 NOTE — Telephone Encounter (Signed)
Contacted patient to scheduled appointments. Patient is aware of appointments that are scheduled.   

## 2023-03-07 ENCOUNTER — Ambulatory Visit
Admission: RE | Admit: 2023-03-07 | Discharge: 2023-03-07 | Disposition: A | Discharge status: Home / Self Care | Payer: Medicare (Managed Care) | Source: Ambulatory Visit

## 2023-03-07 ENCOUNTER — Other Ambulatory Visit: Payer: Self-pay

## 2023-03-07 DIAGNOSIS — R0789 Other chest pain: Secondary | ICD-10-CM

## 2023-03-14 ENCOUNTER — Inpatient Hospital Stay: Payer: Medicare (Managed Care) | Attending: Hematology

## 2023-03-14 ENCOUNTER — Inpatient Hospital Stay: Payer: Medicare (Managed Care) | Admitting: Hematology

## 2023-03-14 DIAGNOSIS — Z8673 Personal history of transient ischemic attack (TIA), and cerebral infarction without residual deficits: Secondary | ICD-10-CM | POA: Insufficient documentation

## 2023-03-14 NOTE — Assessment & Plan Note (Deleted)
-  on Eliquis 5 mg BID, metoprolol, and statin -continue per PCP

## 2023-03-14 NOTE — Assessment & Plan Note (Deleted)
-  In the setting of treated hep C and cirrhosis, she had incidental LIRADS 4 liver lesion while hospitalized for COVID in 12/2019 that enlarged in 07/2020.  Staging work-up was negative for metastatic disease. -AFP reportedly normal -She underwent liver biopsy and TACE procedure at [facility] on 10/03/20, path was not diagnostic for [facility]. She is now on surveillance. -Surveillance MRI 07/08/22 (w/o contrast due to pt's difficult veins access) showed no change in treated liver lesion, no obvious new lesions. AFP same day was normal. I reviewed the results with them. I also discussed the limitation of the scan without contrast, but pt is reluctant to consider iv contrast given difficulty. She is clinically doing well and symptomatic, so we will continue to monitor. -she is clinically doing well. Labs from 7/17 reviewed, overall stable. Physical exam was unremarkable (within limitation of pt being in wheelchair). There is no clinical concern for recurrence. -if MRI is not feasible for disease monitoring, will do liver US every 6 months then  -his liver MRI in 12/2022 exam is limited by motion and the fact that patient halted the exam after the arterial phase postcontrast dynamic series.it showed grossly similar treated lesion within the high right hepatic lobe, and cirrhosis without dominant new liver lesion. I reviewed with him

## 2023-03-14 NOTE — Assessment & Plan Note (Deleted)
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 Per pt she did not undergo subsequent routine HCC screening  -no recent drug or alcohol use -liver function is well compensated, normal albumin, LFTs and bili  -She gets medical care/coverage through [facility] 

## 2023-03-14 NOTE — Progress Notes (Deleted)
[place]   Telephone:(336) [phone] Fax:(336) [phone]   Clinic Follow up Note   Patient Care Team: Inc, [place] as PCP - Charissa Bash, MD as Medical Oncologist (Oncology) Alla Feeling, NP as Nurse Practitioner (Oncology)  Date of Service:  03/14/2023  CHIEF COMPLAINT: f/u of [place]   CURRENT THERAPY:  Surveillance   ASSESSMENT: *** Audrey Weber is a 77 y.o. female with   No problem-specific Assessment & Plan notes found for this encounter.  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  No convincing evidence of pancreatitis or complications thereof, within the limitations of noncontrast technique.  2.  There is a new 2.2 cm low-density lesion in hepatic segment 7. Given background cirrhosis, this warrants further evaluation with nonemergent hepatic protocol CT or MR.  3.  Patchy areas of consolidation in the lower lungs may be sequela of aspiration or pneumonia.   12/31/2019 Imaging   CT AP with contrast  1.  LI-RADS 4 lesion in segment 7 measuring 2.2 cm as further detailed above.  2.  Incidental note of pulmonary embolus within the right lower lobe, partially imaged.  3.  Cirrhotic morphology of the liver.    07/17/2020 Imaging   CT abdomen with contrast  1.  Compared to 12/31/2019, there has been interval increase in size of the LI-RADS 4 lesion in hepatic segment 7, which now measures 2.9 x 1.9 x 2.3 cm.  2.  Cirrhotic morphology of liver.   07/26/2020 Procedure   EGD -Mild gastritis and gastric antrum -No evidence of varices   09/26/2020 Imaging   MR liver with and without contrast  1.   Redemonstrated LIRADS 4 lesion in the central aspect of segment 7, slightly larger. No other lesions are identified. No adenopathy.  2.  Minimally complex left lower pole cystic lesion, attention on follow up.    10/03/2020 Procedure   Patient underwent TACE and liver biopsy at [facility]   10/03/2020 Initial Biopsy   Specimen A.  Final Diagnosis    A. LIVER, NEEDLE CORE BIOPSY II (SMEAR AND CELL BLOCK):              No definitive evidence of malignancy.               See COMMENT.   Electronically signed by Donzetta Sprung, MD on 10/04/2020 at  2:05 PM  Specimen A.  Adequacy  Satisfactory for evaluation.   Specimen B.  Final Interpretation    B. LIVER, NEEDLE CORE BIOPSY:              No definitive evidence of malignancy.               See COMMENT.    Specimen B.  Diagnosis Comment    The needle core biopsy shows liver parenchyma with well-formed cirrhosis, bile ductular proliferation and macro and microvesicular steatosis. No definitive evidence of hepatocellular carcinoma is identified on the limited core biopsy. Well-formed cirrhosis can could mimic a mass lesion however, an unsampled mass lesion cannot be entirely excluded. Re-biopsy for more diagnostic material is recommended if clinically indicated.  The case was reviewed in consultation with Dr. Laurena Spies.      12/24/2020 Imaging   CT AP without contrast  1.  No acute abdominopelvic abnormalities.  2.  Increasing size of treated central posterior right hepatic lobe lesion, evaluation is otherwise limited given lack of intravenous contrast. Consider multiphase CT or MRI with contrast of the abdomen for further evaluation.  3.  Right adnexal cyst with septation and calcification. Cystic tumor can have this appearance. Ultrasound suggested for further characterization.  4.  Ancillary findings as above.   01/09/2021 Imaging   MRI liver with and without contrast 1.  Continued increase in size of the treated posterior right hepatic lobe  lesion favored to reflect posttreatment changes. No enhancement to suggest residual disease. No new lesions.  2.  Partially imaged defect along the posterior aspect of the descending thoracic aorta which may reflect unstable plaque or focal dissection. In retrospect, this was present on the chest CT dated 11/07/2020. Additional area of unstable plaque along the anterior aspect of the infrarenal abdominal aorta. Recommend CTA chest/abdomen/pelvis and outpatient vascular surgery referral.   02/10/2021 Imaging   CTAP without contrast  1.  No acute findings in the abdomen or pelvis.  2.  Scattered sub-6 mm nodules in the imaged lungs, nonspecific, though may be infectious or inflammatory nature. There is also tree-in-bud nodularity suggesting immaturity/infectious etiologies. Attention on follow-up examinations.  3.  Similar appearance of the treated posterior right hepatic lesion, limited evaluation in the absence of IV contrast. Consider multiphase CT or MRI with contrast of the abdomen for further evaluation as suggested on prior CT.  Similar right adnexal cyst. Ultrasound again suggested for further characterization.  4.  Ancillary  findings as above.   08/02/2021 Imaging   MR abdomen with and without contrast IMPRESSION: Cirrhosis. 5.6 cm treated lesion in the medial right hepatic lobe, without findings to suggest viable tumor.     09/10/2021 Initial Diagnosis   Hepatocellular carcinoma ([place])   09/10/2021 Cancer Staging   Staging form: Liver, AJCC 8th Edition - Clinical stage from 09/10/2021: Stage IB (cT1b, cN0, cM0) - Signed by Alla Feeling, NP on 09/10/2021   01/10/2022 Imaging   MR abd w/wo contrast IMPRESSION: 1. Interval decreased size of treated lesion in the medial right hepatic lobe. No findings to suggest viable tumor. 2. Numerous subcentimeter arterially enhancing lesions are seen throughout the liver which demonstrate no evidence of washout (LI-RADS 3).     03/17/2022 Imaging    CT AP w contrast (done in ED 03/17/22) IMPRESSION: Treated lesion within the medial aspect of the right lobe of the liver in the dome of the liver consistent with the given clinical history. This is stable from previous MRI.   Cholelithiasis without complicating factors.   Mucosal enhancement within the posterior aspect of the bladder likely related to the decompressed state. No discrete mass is noted.      INTERVAL HISTORY: *** Audrey Weber is here for a follow up of [facility]  She was last seen by me on 07/18/2022. She presents to the clinic    All other systems were reviewed with the patient and are negative.  MEDICAL HISTORY:  Past Medical History:  Diagnosis Date   Hypertension     SURGICAL HISTORY: Past Surgical History:  Procedure Laterality Date   ABDOMINAL HYSTERECTOMY     IR GASTROSTOMY TUBE REMOVAL  11/20/2021    I have reviewed the social history and family history with the patient and they  are unchanged from previous note.  ALLERGIES:  has No Known Allergies.  MEDICATIONS:  Current Outpatient Medications  Medication Sig Dispense Refill   apixaban (ELIQUIS) 5 MG TABS tablet Take 5 mg by mouth 2 (two) times daily.     famotidine (PEPCID) 20 MG tablet Take 20 mg by mouth 2 (two) times daily.     magnesium oxide (MAG-OX) 400 MG tablet Take 400 mg by mouth daily.     Metoprolol Tartrate 37.5 MG TABS Take 1 tablet by mouth in the morning and at bedtime.     sertraline (ZOLOFT) 50 MG tablet Take 50 mg by mouth daily.     simvastatin (ZOCOR) 40 MG tablet Take 40 mg by mouth every evening.     No current facility-administered medications for this visit.    PHYSICAL EXAMINATION: ECOG PERFORMANCE STATUS: {CHL ONC ECOG [phone]}  There were no vitals filed for this visit. Wt Readings from Last 3 Encounters:  07/18/22 194 lb 14.4 oz (88.4 kg)  09/10/21 196 lb 14.4 oz (89.3 kg)  03/21/20 224 lb (101.6 kg)    {Only keep what was examined. If exam not performed, can  use .CEXAM } GENERAL:alert, no distress and comfortable SKIN: skin color, texture, turgor are normal, no rashes or significant lesions EYES: normal, Conjunctiva are pink and non-injected, sclera clear {OROPHARYNX:no exudate, no erythema and lips, buccal mucosa, and tongue normal}  NECK: supple, thyroid normal size, non-tender, without nodularity LYMPH:  no palpable lymphadenopathy in the cervical, axillary {or inguinal} LUNGS: clear to auscultation and percussion with normal breathing effort HEART: regular rate & rhythm and no murmurs and no lower extremity edema ABDOMEN:abdomen soft, non-tender and normal bowel sounds Musculoskeletal:no cyanosis of digits and no clubbing  NEURO: alert & oriented x 3 with fluent speech, no focal motor/sensory deficits  LABORATORY DATA:  I have reviewed the data as listed    Latest Ref Rng & Units 07/08/2022   11:31 AM 03/17/2022    7:18 PM 01/11/2022   11:06 AM  CBC  WBC 4.0 - 10.5 K/uL 6.2  5.7  5.1   Hemoglobin 12.0 - 15.0 g/dL 12.1  13.7  12.4   Hematocrit 36.0 - 46.0 % 37.9  43.7  39.7   Platelets 150 - 400 K/uL 127  141  128         Latest Ref Rng & Units 07/08/2022   11:31 AM 03/17/2022    8:39 PM 03/17/2022    7:18 PM  CMP  Glucose 70 - 99 mg/dL 102   83   BUN 8 - 23 mg/dL 17   11   Creatinine 0.44 - 1.00 mg/dL 0.97   0.99   Sodium 135 - 145 mmol/L 143   140   Potassium 3.5 - 5.1 mmol/L 4.3   4.6   Chloride 98 - 111 mmol/L 109   105   CO2 22 - 32 mmol/L 30   26   Calcium 8.9 - 10.3 mg/dL 9.7   9.8   Total Protein 6.5 - 8.1 g/dL 8.3  8.7    Total Bilirubin 0.3 - 1.2 mg/dL 0.5  0.8    Alkaline Phos 38 - 126 U/L 114  65    AST 15 - 41 U/L 20  28    ALT 0 - 44 U/L 10  14        RADIOGRAPHIC STUDIES: I have personally reviewed the radiological images as listed and agreed with the findings in the report. No results found.  No orders of the defined types were placed in this encounter.  All questions were answered. The patient  knows to call the clinic with any problems, questions or concerns. No barriers to learning was detected. The total time spent in the appointment was {CHL ONC TIME VISIT - ZX:[id number].     Baldemar Friday, CMA 03/14/2023   I, Audry Riles, CMA, am acting as scribe for Truitt Merle, MD.   {Add scribe attestation statement}

## 2023-04-08 ENCOUNTER — Telehealth: Payer: Self-pay | Admitting: Hematology

## 2023-04-08 NOTE — Telephone Encounter (Signed)
Contacted patient to scheduled appointments. Left message with appointment details and a call back number if patient had any questions or could not accommodate the time we provided.   

## 2023-04-09 ENCOUNTER — Telehealth: Payer: Self-pay | Admitting: Hematology

## 2023-04-09 NOTE — Telephone Encounter (Signed)
[facility] called to reschedule patients missed appointment.

## 2023-04-11 ENCOUNTER — Inpatient Hospital Stay (HOSPITAL_BASED_OUTPATIENT_CLINIC_OR_DEPARTMENT_OTHER): Payer: Medicare (Managed Care) | Admitting: Hematology

## 2023-04-11 ENCOUNTER — Inpatient Hospital Stay: Payer: Medicare (Managed Care) | Attending: Hematology

## 2023-04-11 ENCOUNTER — Encounter: Payer: Self-pay | Admitting: Hematology

## 2023-04-11 ENCOUNTER — Other Ambulatory Visit: Payer: Self-pay

## 2023-04-11 VITALS — BP 145/74 | HR 69 | Temp 98.9°F | Resp 18 | Ht 65.0 in | Wt 217.8 lb

## 2023-04-11 DIAGNOSIS — C220 Liver cell carcinoma: Secondary | ICD-10-CM | POA: Insufficient documentation

## 2023-04-11 DIAGNOSIS — Z79899 Other long term (current) drug therapy: Secondary | ICD-10-CM | POA: Diagnosis not present

## 2023-04-11 DIAGNOSIS — K7460 Unspecified cirrhosis of liver: Secondary | ICD-10-CM | POA: Diagnosis not present

## 2023-04-11 DIAGNOSIS — Z8673 Personal history of transient ischemic attack (TIA), and cerebral infarction without residual deficits: Secondary | ICD-10-CM | POA: Diagnosis not present

## 2023-04-11 DIAGNOSIS — Z7901 Long term (current) use of anticoagulants: Secondary | ICD-10-CM | POA: Insufficient documentation

## 2023-04-11 DIAGNOSIS — Z993 Dependence on wheelchair: Secondary | ICD-10-CM | POA: Diagnosis not present

## 2023-04-11 LAB — CBC WITH DIFFERENTIAL (CANCER CENTER ONLY)
Abs Immature Granulocytes: 0.01 10*3/uL (ref 0.00–0.07)
Basophils Absolute: 0 10*3/uL (ref 0.0–0.1)
Basophils Relative: 0 %
Eosinophils Absolute: 0 10*3/uL (ref 0.0–0.5)
Eosinophils Relative: 1 %
HCT: 38.3 % (ref 36.0–46.0)
Hemoglobin: 11.8 g/dL — ABNORMAL LOW (ref 12.0–15.0)
Immature Granulocytes: 0 %
Lymphocytes Relative: 69 %
Lymphs Abs: 4.2 10*3/uL — ABNORMAL HIGH (ref 0.7–4.0)
MCH: 28.9 pg (ref 26.0–34.0)
MCHC: 30.8 g/dL (ref 30.0–36.0)
MCV: 93.6 fL (ref 80.0–100.0)
Monocytes Absolute: 0.3 10*3/uL (ref 0.1–1.0)
Monocytes Relative: 5 %
Neutro Abs: 1.5 10*3/uL — ABNORMAL LOW (ref 1.7–7.7)
Neutrophils Relative %: 25 %
Platelet Count: 124 10*3/uL — ABNORMAL LOW (ref 150–400)
RBC: 4.09 MIL/uL (ref 3.87–5.11)
RDW: 15.9 % — ABNORMAL HIGH (ref 11.5–15.5)
WBC Count: 6.2 10*3/uL (ref 4.0–10.5)
nRBC: 0 % (ref 0.0–0.2)

## 2023-04-11 LAB — CMP (CANCER CENTER ONLY)
ALT: 15 U/L (ref 0–44)
AST: 25 U/L (ref 15–41)
Albumin: 3.6 g/dL (ref 3.5–5.0)
Alkaline Phosphatase: 104 U/L (ref 38–126)
Anion gap: 3 — ABNORMAL LOW (ref 5–15)
BUN: 23 mg/dL (ref 8–23)
CO2: 31 mmol/L (ref 22–32)
Calcium: 9.5 mg/dL (ref 8.9–10.3)
Chloride: 107 mmol/L (ref 98–111)
Creatinine: 1.08 mg/dL — ABNORMAL HIGH (ref 0.44–1.00)
GFR, Estimated: 52 mL/min — ABNORMAL LOW (ref >60)
Glucose, Bld: 88 mg/dL (ref 70–99)
Potassium: 4.9 mmol/L (ref 3.5–5.1)
Sodium: 141 mmol/L (ref 135–145)
Total Bilirubin: 0.4 mg/dL (ref 0.3–1.2)
Total Protein: 7.9 g/dL (ref 6.5–8.1)

## 2023-04-11 NOTE — Progress Notes (Signed)
[facility]   Telephone:(336) [phone] Fax:(336) [phone]   Clinic Follow up Note   Patient Care Team: Inc, [place] [facility] [place] [facility] [place] as PCP - Serita Grit, MD as Medical Oncologist (Oncology) Pollyann Samples, NP as Nurse Practitioner (Oncology)  Date of Service:  04/11/2023  CHIEF COMPLAINT: f/u of HCC  CURRENT THERAPY:  Surveillance  ASSESSMENT & PLAN:  Audrey Weber is a 77 y.o. female with   1. Liver lesion LIRADS 4 suspected HCC, cT1bN0M0 stage IB -In the setting of treated hep C and cirrhosis, she had incidental LIRADS 4 liver lesion while hospitalized for COVID in 12/2019 that enlarged in 07/2020.  Staging work-up was negative for metastatic disease. -AFP reportedly normal -She underwent liver biopsy and TACE procedure at [facility] on 10/03/20, path was not diagnostic for [facility]. She is now on surveillance. -Liver MRI with and without contrast on January 2024 showed grossly similar treated lesion in the right hepatic lobe, no new lesions.  However the MRI was very limited due to the motion effect and patient stopped the exam after the arterial phase. -Patient is clinically doing very well, no pain at all other symptoms.  There is no clinical concern for cancer progression. -Patient is wheelchair-bound, prefers to follow-up with her PCP.  Due to her advanced age and other medical comorbidities, I think that is a reasonable decision, I will see her as needed in future.  We discussed what symptoms to watch at home.   2. 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 Per pt she did not undergo subsequent routine HCC screening  -no recent drug or alcohol use -liver function is well compensated, normal albumin, LFTs and bili  -She gets medical care/coverage through [facility]   3. H/o CVA -on Eliquis 5 mg BID, metoprolol, and statin -continue per PCP    PLAN: -Lab and MRI scan results  discussed with patient, no evidence of cancer progression. -She is wheelchair-bound, preferred to follow-up with her PCP.  I will see her as needed in the future.  I called her daughter and left her message about the decision.  SUMMARY OF ONCOLOGIC HISTORY: Oncology History Overview Note   Cancer Staging  Hepatocellular carcinoma [facility]) Staging form: Liver, AJCC 8th Edition - Clinical stage from 09/10/2021: Stage IB (cT1b, cN0, cM0) - Signed by Pollyann Samples, NP on 09/10/2021     Hepatocellular carcinoma  12/30/2019 Imaging   CT CAP impression [facility]  1.  No convincing evidence of pancreatitis or complications thereof, within the limitations of noncontrast technique.  2.  There is a new 2.2 cm low-density lesion in hepatic segment 7. Given background cirrhosis, this warrants further evaluation with nonemergent hepatic protocol CT or MR.  3.  Patchy areas of consolidation in the lower lungs may be sequela of aspiration or pneumonia.   12/31/2019 Imaging   CT AP with contrast  1.  LI-RADS 4 lesion in segment 7 measuring 2.2 cm as further detailed above.  2.  Incidental note of pulmonary embolus within the right lower lobe, partially imaged.  3.  Cirrhotic morphology of the liver.    07/17/2020 Imaging   CT abdomen with contrast  1.  Compared to 12/31/2019, there has been interval increase in size of the LI-RADS 4 lesion in hepatic segment 7, which now measures 2.9 x 1.9 x 2.3 cm.  2.  Cirrhotic morphology of liver.  07/26/2020 Procedure   EGD -Mild gastritis and gastric antrum -No evidence of varices   09/26/2020 Imaging   MR liver with and without contrast  1.  Redemonstrated LIRADS 4 lesion in the central aspect of segment 7, slightly larger. No other lesions are identified. No adenopathy.  2.  Minimally complex left lower pole cystic lesion, attention on follow up.    10/03/2020 Procedure   Patient underwent TACE and liver biopsy at [facility]   10/03/2020 Initial Biopsy    Specimen A.  Final Diagnosis    A. LIVER, NEEDLE CORE BIOPSY II (SMEAR AND CELL BLOCK):              No definitive evidence of malignancy.               See COMMENT.   Electronically signed by Ewing Schlein, MD on 10/04/2020 at  2:05 PM  Specimen A.  Adequacy  Satisfactory for evaluation.   Specimen B.  Final Interpretation    B. LIVER, NEEDLE CORE BIOPSY:              No definitive evidence of malignancy.               See COMMENT.    Specimen B.  Diagnosis Comment    The needle core biopsy shows liver parenchyma with well-formed cirrhosis, bile ductular proliferation and macro and microvesicular steatosis. No definitive evidence of hepatocellular carcinoma is identified on the limited core biopsy. Well-formed cirrhosis can could mimic a mass lesion however, an unsampled mass lesion cannot be entirely excluded. Re-biopsy for more diagnostic material is recommended if clinically indicated. The case was reviewed in consultation with Dr. Reynolds Bowl.      12/24/2020 Imaging   CT AP without contrast  1.  No acute abdominopelvic abnormalities.  2.  Increasing size of treated central posterior right hepatic lobe lesion, evaluation is otherwise limited given lack of intravenous contrast. Consider multiphase CT or MRI with contrast of the abdomen for further evaluation.  3.  Right adnexal cyst with septation and calcification. Cystic tumor can have this appearance. Ultrasound suggested for further characterization.  4.  Ancillary findings as above.   01/09/2021 Imaging   MRI liver with and without contrast 1.  Continued increase in size of the treated posterior right hepatic lobe lesion favored to reflect posttreatment changes. No enhancement to suggest residual disease. No new lesions.  2.  Partially imaged defect along the posterior aspect of the descending thoracic aorta which may reflect unstable plaque or focal dissection. In retrospect, this was present on the chest CT dated 11/07/2020.  Additional area of unstable plaque along the anterior aspect of the infrarenal abdominal aorta. Recommend CTA chest/abdomen/pelvis and outpatient vascular surgery referral.   02/10/2021 Imaging   CTAP without contrast  1.  No acute findings in the abdomen or pelvis.  2.  Scattered sub-6 mm nodules in the imaged lungs, nonspecific, though may be infectious or inflammatory nature. There is also tree-in-bud nodularity suggesting immaturity/infectious etiologies. Attention on follow-up examinations.  3.  Similar appearance of the treated posterior right hepatic lesion, limited evaluation in the absence of IV contrast. Consider multiphase CT or MRI with contrast of the abdomen for further evaluation as suggested on prior CT.  Similar right adnexal cyst. Ultrasound again suggested for further characterization.  4.  Ancillary  findings as above.   08/02/2021 Imaging   MR abdomen with and without contrast IMPRESSION: Cirrhosis. 5.6 cm treated lesion in the medial right  hepatic lobe, without findings to suggest viable tumor.     09/10/2021 Initial Diagnosis   Hepatocellular carcinoma ([facility])   09/10/2021 Cancer Staging   Staging form: Liver, AJCC 8th Edition - Clinical stage from 09/10/2021: Stage IB (cT1b, cN0, cM0) - Signed by Pollyann Samples, NP on 09/10/2021   01/10/2022 Imaging   MR abd w/wo contrast IMPRESSION: 1. Interval decreased size of treated lesion in the medial right hepatic lobe. No findings to suggest viable tumor. 2. Numerous subcentimeter arterially enhancing lesions are seen throughout the liver which demonstrate no evidence of washout (LI-RADS 3).     03/17/2022 Imaging   CT AP w contrast (done in ED 03/17/22) IMPRESSION: Treated lesion within the medial aspect of the right lobe of the liver in the dome of the liver consistent with the given clinical history. This is stable from previous MRI.   Cholelithiasis without complicating factors.   Mucosal enhancement within the posterior  aspect of the bladder likely related to the decompressed state. No discrete mass is noted.      INTERVAL HISTORY:  Kyanna Mahrt is here for a follow up of HCC. She was last seen by NP Lacie on 04/15/22. She presents to the clinic accompanied by a caregiver with [facility].  She is clinically stable, she has been wheelchair-bound since her stroke, to go to [place] and participate with activities there, including chair exercises.  She feels fatigued which is chronic, and sleeps a lot at home.  She denies any abdominal pain, bloating, nausea, or other symptoms.  She is eating very well, weight is stable, no concerns.   All other systems were reviewed with the patient and are negative.  MEDICAL HISTORY:  Past Medical History:  Diagnosis Date   Hypertension     SURGICAL HISTORY: Past Surgical History:  Procedure Laterality Date   ABDOMINAL HYSTERECTOMY     IR GASTROSTOMY TUBE REMOVAL  11/20/2021    I have reviewed the social history and family history with the patient and they are unchanged from previous note.  ALLERGIES:  has No Known Allergies.  MEDICATIONS:  Current Outpatient Medications  Medication Sig Dispense Refill   apixaban (ELIQUIS) 5 MG TABS tablet Take 5 mg by mouth 2 (two) times daily.     famotidine (PEPCID) 20 MG tablet Take 20 mg by mouth 2 (two) times daily.     magnesium oxide (MAG-OX) 400 MG tablet Take 400 mg by mouth daily.     Metoprolol Tartrate 37.5 MG TABS Take 1 tablet by mouth in the morning and at bedtime.     sertraline (ZOLOFT) 50 MG tablet Take 50 mg by mouth daily.     simvastatin (ZOCOR) 40 MG tablet Take 40 mg by mouth every evening.     No current facility-administered medications for this visit.    PHYSICAL EXAMINATION: ECOG PERFORMANCE STATUS: 2 - Symptomatic, <50% confined to bed  Vitals:   04/11/23 1029  BP: (!) 145/74  Pulse: 69  Resp: 18  Temp: 98.9 F (37.2 C)  SpO2: 99%    Wt Readings from Last 3 Encounters:  04/11/23 217 lb 12.8 oz  (98.8 kg)  07/18/22 194 lb 14.4 oz (88.4 kg)  09/10/21 196 lb 14.4 oz (89.3 kg)     GENERAL:alert, no distress and comfortable SKIN: skin color, texture, turgor are normal, no rashes or significant lesions EYES: normal, Conjunctiva are pink and non-injected, sclera clear  LUNGS: clear to auscultation and percussion with normal breathing effort HEART: regular rate &  rhythm and no murmurs and no lower extremity edema ABDOMEN:abdomen soft, non-tender and normal bowel sounds NEURO: alert & oriented x 3 with fluent speech, no focal motor/sensory deficits  LABORATORY DATA:  I have reviewed the data as listed    Latest Ref Rng & Units 04/11/2023    9:18 AM 07/08/2022   11:31 AM 03/17/2022    7:18 PM  CBC  WBC 4.0 - 10.5 K/uL 6.2  6.2  5.7   Hemoglobin 12.0 - 15.0 g/dL 53.6  64.4  03.4   Hematocrit 36.0 - 46.0 % 38.3  37.9  43.7   Platelets 150 - 400 K/uL 124  127  141         Latest Ref Rng & Units 04/11/2023    9:18 AM 07/08/2022   11:31 AM 03/17/2022    8:39 PM  CMP  Glucose 70 - 99 mg/dL 88  742    BUN 8 - 23 mg/dL 23  17    Creatinine 5.95 - 1.00 mg/dL 6.38  7.56    Sodium 433 - 145 mmol/L 141  143    Potassium 3.5 - 5.1 mmol/L 4.9  4.3    Chloride 98 - 111 mmol/L 107  109    CO2 22 - 32 mmol/L 31  30    Calcium 8.9 - 10.3 mg/dL 9.5  9.7    Total Protein 6.5 - 8.1 g/dL 7.9  8.3  8.7   Total Bilirubin 0.3 - 1.2 mg/dL 0.4  0.5  0.8   Alkaline Phos 38 - 126 U/L 104  114  65   AST 15 - 41 U/L ALT 0 - 44 U/L RADIOGRAPHIC STUDIES: I have personally reviewed the radiological images as listed and agreed with the findings in the report. No results found.    No orders of the defined types were placed in this encounter.  All questions were answered. The patient knows to call the clinic with any problems, questions or concerns. No barriers to learning was detected. The total time spent in the appointment was 30 minutes.     Malachy Mood,  MD 04/11/2023

## 2023-04-13 LAB — AFP TUMOR MARKER: AFP, Serum, Tumor Marker: 2.1 ng/mL (ref 0.0–9.2)

## 2023-04-23 ENCOUNTER — Emergency Department (HOSPITAL_BASED_OUTPATIENT_CLINIC_OR_DEPARTMENT_OTHER): Payer: Medicare (Managed Care) | Admitting: Radiology

## 2023-04-23 ENCOUNTER — Encounter (HOSPITAL_BASED_OUTPATIENT_CLINIC_OR_DEPARTMENT_OTHER): Payer: Self-pay

## 2023-04-23 ENCOUNTER — Emergency Department (HOSPITAL_BASED_OUTPATIENT_CLINIC_OR_DEPARTMENT_OTHER)
Admission: EM | Admit: 2023-04-23 | Discharge: 2023-04-24 | Disposition: A | Discharge status: Home / Self Care | Payer: Medicare (Managed Care) | Attending: Emergency Medicine | Admitting: Emergency Medicine

## 2023-04-23 ENCOUNTER — Other Ambulatory Visit: Payer: Self-pay

## 2023-04-23 DIAGNOSIS — U071 COVID-19: Secondary | ICD-10-CM | POA: Diagnosis not present

## 2023-04-23 DIAGNOSIS — Z7901 Long term (current) use of anticoagulants: Secondary | ICD-10-CM | POA: Diagnosis not present

## 2023-04-23 DIAGNOSIS — R0602 Shortness of breath: Secondary | ICD-10-CM | POA: Diagnosis present

## 2023-04-23 DIAGNOSIS — Z8673 Personal history of transient ischemic attack (TIA), and cerebral infarction without residual deficits: Secondary | ICD-10-CM | POA: Diagnosis not present

## 2023-04-23 MED ORDER — ALBUTEROL SULFATE HFA 108 (90 BASE) MCG/ACT IN AERS: 2.0000 | INHALATION_SPRAY | RESPIRATORY_TRACT | Status: DC | PRN

## 2023-04-23 NOTE — ED Triage Notes (Addendum)
Patient arrives to ED BIB [facility] due to [facility] and Cough. Per EMS pt was at [facility] and came back home coughing and feeling a little SHOB. Pt A/O x4. Hx of Aspiration Pneumonia. 2LNC Baseline

## 2023-04-24 ENCOUNTER — Other Ambulatory Visit: Payer: Self-pay

## 2023-04-24 LAB — BASIC METABOLIC PANEL
Anion gap: 7 (ref 5–15)
BUN: 23 mg/dL (ref 8–23)
CO2: 28 mmol/L (ref 22–32)
Calcium: 9.6 mg/dL (ref 8.9–10.3)
Chloride: 101 mmol/L (ref 98–111)
Creatinine, Ser: 1.03 mg/dL — ABNORMAL HIGH (ref 0.44–1.00)
GFR, Estimated: 55 mL/min — ABNORMAL LOW (ref >60)
Glucose, Bld: 99 mg/dL (ref 70–99)
Potassium: 5.1 mmol/L (ref 3.5–5.1)
Sodium: 136 mmol/L (ref 135–145)

## 2023-04-24 LAB — CBC WITH DIFFERENTIAL/PLATELET
Abs Immature Granulocytes: 0.01 10*3/uL (ref 0.00–0.07)
Basophils Absolute: 0 10*3/uL (ref 0.0–0.1)
Basophils Relative: 0 %
Eosinophils Absolute: 0 10*3/uL (ref 0.0–0.5)
Eosinophils Relative: 0 %
HCT: 40.6 % (ref 36.0–46.0)
Hemoglobin: 12.9 g/dL (ref 12.0–15.0)
Immature Granulocytes: 0 %
Lymphocytes Relative: 45 %
Lymphs Abs: 3.6 10*3/uL (ref 0.7–4.0)
MCH: 29.5 pg (ref 26.0–34.0)
MCHC: 31.8 g/dL (ref 30.0–36.0)
MCV: 92.9 fL (ref 80.0–100.0)
Monocytes Absolute: 0.5 10*3/uL (ref 0.1–1.0)
Monocytes Relative: 6 %
Neutro Abs: 3.9 10*3/uL (ref 1.7–7.7)
Neutrophils Relative %: 49 %
Platelets: 119 10*3/uL — ABNORMAL LOW (ref 150–400)
RBC: 4.37 MIL/uL (ref 3.87–5.11)
RDW: 15.8 % — ABNORMAL HIGH (ref 11.5–15.5)
WBC: 8.1 10*3/uL (ref 4.0–10.5)
nRBC: 0 % (ref 0.0–0.2)

## 2023-04-24 LAB — SARS CORONAVIRUS 2 BY RT PCR: SARS Coronavirus 2 by RT PCR: POSITIVE — AB

## 2023-04-24 MED ORDER — PAXLOVID (300/100) 20 X 150 MG & 10 X 100MG PO TBPK: 3.0000 | ORAL_TABLET | Freq: Two times a day (BID) | ORAL | 0 refills | Status: DC

## 2023-04-24 NOTE — ED Notes (Signed)
ED Provider at bedside. 

## 2023-04-24 NOTE — ED Notes (Signed)
Per patient's family member, patient does not utilize home oxygen.

## 2023-04-24 NOTE — ED Notes (Signed)
Pt waiting for daughter to be discharged to be able to be transported home.

## 2023-04-24 NOTE — ED Provider Notes (Signed)
[place] EMERGENCY DEPARTMENT AT [facility]  Provider Note  CSN: [id number] Arrival date & time: 04/23/23 2312  History Chief Complaint  Patient presents with   Shortness of Breath    Audrey Weber is a 77 y.o. female with history of stroke, had severe covid in 2021 complicated by dysphagia and was on PEG tube feedings at home for several months currently is wheelchair bound but overall improved. Daughter at bedside is her primary caregiver reports she has had a mild cough for the last couple of days. Has felt hot but no fever. She was at [facility] today where she apparently did not cough but symptoms worsened at home tonight and so daughter called EMS. Patient does NOT wear O2 at home, placed on 2L [place] by EMS who did not report any hypoxia. She denies any pain. Has a history of aspiration related to her dysphagia   Home Medications Prior to Admission medications   Medication Sig Start Date End Date Taking? Authorizing Provider  nirmatrelvir & ritonavir (PAXLOVID, 300/100,) 20 x 150 MG & 10 x 100MG  TBPK Take 3 tablets by mouth 2 (two) times daily for 5 days. 04/24/23 04/29/23 Yes Pollyann Savoy, MD  apixaban (ELIQUIS) 5 MG TABS tablet Take 5 mg by mouth 2 (two) times daily.    [provider]  famotidine (PEPCID) 20 MG tablet Take 20 mg by mouth 2 (two) times daily.    [provider]  magnesium oxide (MAG-OX) 400 MG tablet Take 400 mg by mouth daily.    [provider]  Metoprolol Tartrate 37.5 MG TABS Take 1 tablet by mouth in the morning and at bedtime.    [provider]  sertraline (ZOLOFT) 50 MG tablet Take 50 mg by mouth daily.    [provider]  simvastatin (ZOCOR) 40 MG tablet Take 40 mg by mouth every evening.    [provider]     Allergies    Patient has no known allergies.   Review of Systems   Review of Systems Please see HPI for pertinent positives and negatives  Physical Exam BP 130/86   Pulse 84   Temp  98.3 F (36.8 C)   Resp 19   Ht 5' (1.524 m)   Wt 98.8 kg   SpO2 94%   BMI 42.54 kg/m   Physical Exam Vitals and nursing note reviewed.  Constitutional:      General: She is not in acute distress.    Appearance: Normal appearance.  HENT:     Head: Normocephalic and atraumatic.     Nose: Nose normal.     Mouth/Throat:     Mouth: Mucous membranes are moist.  Eyes:     Extraocular Movements: Extraocular movements intact.     Conjunctiva/sclera: Conjunctivae normal.  Cardiovascular:     Rate and Rhythm: Normal rate.  Pulmonary:     Effort: Pulmonary effort is normal.     Breath sounds: Rhonchi present. No wheezing.  Abdominal:     General: Abdomen is flat.     Palpations: Abdomen is soft.     Tenderness: There is no abdominal tenderness. There is no guarding.  Musculoskeletal:        General: No swelling. Normal range of motion.     Cervical back: Neck supple.     Right lower leg: No edema.     Left lower leg: No edema.  Skin:    General: Skin is warm and dry.  Neurological:  General: No focal deficit present.     Mental Status: She is alert.  Psychiatric:        Mood and Affect: Mood normal.     ED Results / Procedures / Treatments   EKG None  Procedures Procedures  Medications Ordered in the ED Medications  albuterol (VENTOLIN HFA) 108 (90 Base) MCG/ACT inhaler 2 puff (has no administration in time range)    Initial Impression and Plan  Patient here with cough, worsening over the last couple of days. Has history of aspiration so consider PNA. Also has had sick contact at home, consider Covid or other viral causes. Overall patient is well appearing in no distress. Will check basic labs, CXR, Covid swab and EKG. Patient is not hypoxic on room air.   ED Course   Clinical Course as of 04/24/23 0239  Thu Apr 24, 2023  0103 Covid is positive. I personally viewed the images from radiology studies and agree with radiologist interpretation: CXR without  infiltrates.  [CS]  0236 CBC and BMP are unremarkable. Patient continues to rest comfortably, no hypoxia while on room air. Discussed positive covid test with daughter at bedside. She does not meet admission criteria at this time, plan d/c with Rx for Paxlovid and outpatient follow up (hold Eliquis and statin while on Paxlovid). RTED for worsening symptoms or persistent hypoxia.  [CS]    Clinical Course User Index [CS] Pollyann Savoy, MD     MDM Rules/Calculators/A&P Medical Decision Making Problems Addressed: COVID-19: acute illness or injury  Amount and/or Complexity of Data Reviewed Labs: ordered. Decision-making details documented in ED Course. Radiology: ordered and independent interpretation performed. Decision-making details documented in ED Course. ECG/medicine tests: ordered and independent interpretation performed. Decision-making details documented in ED Course.  Risk Prescription drug management.     Final Clinical Impression(s) / ED Diagnoses Final diagnoses:  COVID-19    Rx / DC Orders ED Discharge Orders          Ordered    nirmatrelvir & ritonavir (PAXLOVID, 300/100,) 20 x 150 MG & 10 x 100MG  TBPK  2 times daily        04/24/23 0238             Pollyann Savoy, MD 04/24/23 [phone]

## 2023-04-24 NOTE — ED Notes (Addendum)
Pt changed by this RN and Asher Muir, EDT d/t soiling brief.  New brief and paper pants put on pt.  Pt assisted out to car afterwards for discharge.

## 2023-04-24 NOTE — Discharge Instructions (Signed)
You have been prescribed Paxlovid for Covid infection. You will need to hold your simvastatin (Zocor) and apixaban (Eliquis) while taking this medication and for 48 hours after completion.

## 2023-04-26 ENCOUNTER — Emergency Department (HOSPITAL_COMMUNITY): Payer: Medicare (Managed Care)

## 2023-04-26 ENCOUNTER — Observation Stay (HOSPITAL_COMMUNITY)
Admission: EM | Admit: 2023-04-26 | Discharge: 2023-04-27 | Disposition: A | Discharge status: Home / Self Care | Payer: Medicare (Managed Care) | Attending: Family Medicine | Admitting: Family Medicine

## 2023-04-26 ENCOUNTER — Encounter (HOSPITAL_COMMUNITY): Payer: Self-pay

## 2023-04-26 ENCOUNTER — Other Ambulatory Visit: Payer: Self-pay

## 2023-04-26 DIAGNOSIS — Z7901 Long term (current) use of anticoagulants: Secondary | ICD-10-CM | POA: Insufficient documentation

## 2023-04-26 DIAGNOSIS — Z79899 Other long term (current) drug therapy: Secondary | ICD-10-CM | POA: Diagnosis not present

## 2023-04-26 DIAGNOSIS — U071 COVID-19: Principal | ICD-10-CM | POA: Insufficient documentation

## 2023-04-26 DIAGNOSIS — J45909 Unspecified asthma, uncomplicated: Secondary | ICD-10-CM | POA: Diagnosis not present

## 2023-04-26 DIAGNOSIS — R0602 Shortness of breath: Secondary | ICD-10-CM | POA: Diagnosis present

## 2023-04-26 DIAGNOSIS — D696 Thrombocytopenia, unspecified: Secondary | ICD-10-CM | POA: Diagnosis not present

## 2023-04-26 DIAGNOSIS — Z86711 Personal history of pulmonary embolism: Secondary | ICD-10-CM | POA: Insufficient documentation

## 2023-04-26 DIAGNOSIS — R0902 Hypoxemia: Secondary | ICD-10-CM

## 2023-04-26 DIAGNOSIS — J449 Chronic obstructive pulmonary disease, unspecified: Secondary | ICD-10-CM | POA: Insufficient documentation

## 2023-04-26 DIAGNOSIS — N179 Acute kidney failure, unspecified: Secondary | ICD-10-CM | POA: Insufficient documentation

## 2023-04-26 DIAGNOSIS — J9859 Other diseases of mediastinum, not elsewhere classified: Secondary | ICD-10-CM | POA: Diagnosis present

## 2023-04-26 DIAGNOSIS — E039 Hypothyroidism, unspecified: Secondary | ICD-10-CM | POA: Insufficient documentation

## 2023-04-26 DIAGNOSIS — Z8673 Personal history of transient ischemic attack (TIA), and cerebral infarction without residual deficits: Secondary | ICD-10-CM | POA: Insufficient documentation

## 2023-04-26 HISTORY — DX: Cerebral infarction, unspecified: I63.9

## 2023-04-26 HISTORY — DX: Hepatomegaly, not elsewhere classified: R16.0

## 2023-04-26 HISTORY — DX: Other pulmonary embolism without acute cor pulmonale: I26.99

## 2023-04-26 LAB — CBC WITH DIFFERENTIAL/PLATELET
Abs Immature Granulocytes: 0.01 10*3/uL (ref 0.00–0.07)
Basophils Absolute: 0 10*3/uL (ref 0.0–0.1)
Basophils Relative: 0 %
Eosinophils Absolute: 0 10*3/uL (ref 0.0–0.5)
Eosinophils Relative: 0 %
HCT: 39.9 % (ref 36.0–46.0)
Hemoglobin: 12.4 g/dL (ref 12.0–15.0)
Immature Granulocytes: 0 %
Lymphocytes Relative: 77 %
Lymphs Abs: 6.5 10*3/uL — ABNORMAL HIGH (ref 0.7–4.0)
MCH: 28.9 pg (ref 26.0–34.0)
MCHC: 31.1 g/dL (ref 30.0–36.0)
MCV: 93 fL (ref 80.0–100.0)
Monocytes Absolute: 0.6 10*3/uL (ref 0.1–1.0)
Monocytes Relative: 8 %
Neutro Abs: 1.3 10*3/uL — ABNORMAL LOW (ref 1.7–7.7)
Neutrophils Relative %: 15 %
Platelets: 135 10*3/uL — ABNORMAL LOW (ref 150–400)
RBC: 4.29 MIL/uL (ref 3.87–5.11)
RDW: 16 % — ABNORMAL HIGH (ref 11.5–15.5)
WBC: 8.5 10*3/uL (ref 4.0–10.5)
nRBC: 0 % (ref 0.0–0.2)

## 2023-04-26 LAB — BASIC METABOLIC PANEL
Anion gap: 10 (ref 5–15)
BUN: 22 mg/dL (ref 8–23)
CO2: 22 mmol/L (ref 22–32)
Calcium: 8.4 mg/dL — ABNORMAL LOW (ref 8.9–10.3)
Chloride: 104 mmol/L (ref 98–111)
Creatinine, Ser: 1.12 mg/dL — ABNORMAL HIGH (ref 0.44–1.00)
GFR, Estimated: 50 mL/min — ABNORMAL LOW (ref >60)
Glucose, Bld: 102 mg/dL — ABNORMAL HIGH (ref 70–99)
Potassium: 4.3 mmol/L (ref 3.5–5.1)
Sodium: 136 mmol/L (ref 135–145)

## 2023-04-26 LAB — COMPREHENSIVE METABOLIC PANEL
ALT: 19 U/L (ref 0–44)
AST: 37 U/L (ref 15–41)
Albumin: 3.3 g/dL — ABNORMAL LOW (ref 3.5–5.0)
Alkaline Phosphatase: 75 U/L (ref 38–126)
Anion gap: 10 (ref 5–15)
BUN: 24 mg/dL — ABNORMAL HIGH (ref 8–23)
CO2: 25 mmol/L (ref 22–32)
Calcium: 8.8 mg/dL — ABNORMAL LOW (ref 8.9–10.3)
Chloride: 99 mmol/L (ref 98–111)
Creatinine, Ser: 1.25 mg/dL — ABNORMAL HIGH (ref 0.44–1.00)
GFR, Estimated: 44 mL/min — ABNORMAL LOW (ref >60)
Glucose, Bld: 103 mg/dL — ABNORMAL HIGH (ref 70–99)
Potassium: 4.2 mmol/L (ref 3.5–5.1)
Sodium: 134 mmol/L — ABNORMAL LOW (ref 135–145)
Total Bilirubin: 0.6 mg/dL (ref 0.3–1.2)
Total Protein: 8.1 g/dL (ref 6.5–8.1)

## 2023-04-26 LAB — TSH: TSH: 1.925 u[IU]/mL (ref 0.350–4.500)

## 2023-04-26 MED ORDER — SODIUM CHLORIDE 0.9 % IV SOLN
200.0000 mg | Freq: Once | INTRAVENOUS | Status: DC
  Filled 2023-04-26: qty 40

## 2023-04-26 MED ORDER — FAMOTIDINE 20 MG PO TABS
20.0000 mg | ORAL_TABLET | Freq: Two times a day (BID) | ORAL | Status: DC
  Administered 2023-04-26 – 2023-04-27 (×3): 20 mg via ORAL
  Filled 2023-04-26 (×3): qty 1

## 2023-04-26 MED ORDER — APIXABAN 5 MG PO TABS
5.0000 mg | ORAL_TABLET | Freq: Two times a day (BID) | ORAL | Status: DC
  Administered 2023-04-26 – 2023-04-27 (×3): 5 mg via ORAL
  Filled 2023-04-26 (×3): qty 1

## 2023-04-26 MED ORDER — IPRATROPIUM-ALBUTEROL 0.5-2.5 (3) MG/3ML IN SOLN
3.0000 mL | Freq: Four times a day (QID) | RESPIRATORY_TRACT | Status: DC
  Administered 2023-04-26 – 2023-04-27 (×4): 3 mL via RESPIRATORY_TRACT
  Filled 2023-04-26 (×4): qty 3

## 2023-04-26 MED ORDER — SIMVASTATIN 20 MG PO TABS
40.0000 mg | ORAL_TABLET | Freq: Every evening | ORAL | Status: DC
  Administered 2023-04-26 – 2023-04-27 (×2): 40 mg via ORAL
  Filled 2023-04-26 (×2): qty 2

## 2023-04-26 MED ORDER — SODIUM CHLORIDE 0.9 % IV BOLUS
500.0000 mL | Freq: Once | INTRAVENOUS | Status: AC
  Administered 2023-04-26: 500 mL via INTRAVENOUS

## 2023-04-26 MED ORDER — SERTRALINE HCL 25 MG PO TABS
50.0000 mg | ORAL_TABLET | Freq: Every day | ORAL | Status: DC
  Administered 2023-04-26 – 2023-04-27 (×2): 50 mg via ORAL
  Filled 2023-04-26: qty 2
  Filled 2023-04-26: qty 1

## 2023-04-26 MED ORDER — ACETAMINOPHEN 325 MG PO TABS
650.0000 mg | ORAL_TABLET | Freq: Four times a day (QID) | ORAL | Status: DC | PRN
  Administered 2023-04-26 – 2023-04-27 (×2): 650 mg via ORAL
  Filled 2023-04-26 (×2): qty 2

## 2023-04-26 MED ORDER — SODIUM CHLORIDE 0.9 % IV SOLN
100.0000 mg | Freq: Every day | INTRAVENOUS | Status: DC
  Administered 2023-04-27: 100 mg via INTRAVENOUS
  Filled 2023-04-26: qty 20

## 2023-04-26 MED ORDER — METOPROLOL TARTRATE 25 MG PO TABS
37.5000 mg | ORAL_TABLET | Freq: Two times a day (BID) | ORAL | Status: DC
  Administered 2023-04-26 – 2023-04-27 (×3): 37.5 mg via ORAL
  Filled 2023-04-26 (×3): qty 2

## 2023-04-26 MED ORDER — DEXAMETHASONE 4 MG PO TABS
6.0000 mg | ORAL_TABLET | Freq: Every day | ORAL | Status: DC
  Administered 2023-04-26 – 2023-04-27 (×2): 6 mg via ORAL
  Filled 2023-04-26 (×2): qty 2

## 2023-04-26 NOTE — Progress Notes (Signed)
FMTS Brief Progress Note  S:Seen on evening rounds. She is sitting up and eating dinner. Ongoing issues with right sided chest pain that is worse with coughing/deep inspiration. No real change since admission by her report.    O: BP 120/76 (BP Location: Right Arm)   Pulse 78   Temp 98.4 F (36.9 C) (Oral)   Resp 19   Ht 5' (1.524 m)   Wt 95.3 kg   SpO2 98%   BMI 41.01 kg/m   Physical Exam Constitutional:      General: She is not in acute distress.    Comments: In good spirits, eating dinner  Cardiovascular:     Rate and Rhythm: Normal rate and regular rhythm.     Heart sounds: No murmur heard. Pulmonary:     Comments: Normal WOB on RA, deep inspiration limited 2/2 pain, good air movement, coarse throughout without wheeze or focal findings     A/P: COVID Stable on room air.  - Continue Decadron and Remdesivir per current inpatient COVID protocol. - Duonebs q6h - Tylenol PRN  AKI, improving Cr. 1.25>1.12 s/p . Suspect this was prerenal in the setting of viral illness, depressed PO intake.  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 Certainly warrants better characterization.  - Can obtain CT with contrast once renal function improves   - Orders reviewed. Labs for AM ordered, which was adjusted as needed.  - If condition changes, plan includes CTA of the chest which would help to r/o PE and better characterize her mediastinal mass.   Alicia Amel, MD 04/26/2023, 7:31 PM PGY-2, [facility] Night Resident  Please page [phone] with questions.

## 2023-04-26 NOTE — Assessment & Plan Note (Addendum)
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 TSH nml. Needs further imaging (CT vs U/S depending on renal function). Not urgent, could continue inpatient vs outpatient.

## 2023-04-26 NOTE — ED Provider Notes (Signed)
[place] EMERGENCY DEPARTMENT AT [facility] Provider Note   CSN: [id number] Arrival date & time: 04/26/23  6578     History  Chief Complaint  Patient presents with   Cough    Audrey Weber is a 77 y.o. female.  Complains of cough and congestion.  Patient was seen here 2 days ago for similar symptoms and was diagnosed with COVID.  EMS was called to patient's home today because she had increasing shortness of breath and discomfort in the right side of her chest.  Patient reports that the discomfort in her chest resolved.  Patient has had increased coughing and increased shortness of breath.  EMS reports patient's oxygen saturations were 86-88 at home they placed her on 2 L of oxygen and O2 sats improved to 95%.  Patient complains of coughing and shortness of breath  The history is provided by the patient, the EMS personnel and a relative. No language interpreter was used.  Cough Cough characteristics:  Productive Sputum characteristics:  Nondescript Severity:  Moderate Timing:  Constant Progression:  Worsening Context: sick contacts and upper respiratory infection   Relieved by:  Nothing Worsened by:  Nothing Ineffective treatments:  None tried Associated symptoms: shortness of breath        Home Medications Prior to Admission medications   Medication Sig Start Date End Date Taking? Authorizing Provider  apixaban (ELIQUIS) 5 MG TABS tablet Take 5 mg by mouth 2 (two) times daily.    [provider]  famotidine (PEPCID) 20 MG tablet Take 20 mg by mouth 2 (two) times daily.    [provider]  magnesium oxide (MAG-OX) 400 MG tablet Take 400 mg by mouth daily.    [provider]  Metoprolol Tartrate 37.5 MG TABS Take 1 tablet by mouth in the morning and at bedtime.    [provider]  nirmatrelvir & ritonavir (PAXLOVID, 300/100,) 20 x 150 MG & 10 x 100MG  TBPK Take 3 tablets by mouth 2 (two) times daily for 5 days. 04/24/23 04/29/23   Pollyann Savoy, MD  sertraline (ZOLOFT) 50 MG tablet Take 50 mg by mouth daily.    [provider]  simvastatin (ZOCOR) 40 MG tablet Take 40 mg by mouth every evening.    [provider]      Allergies    Patient has no known allergies.    Review of Systems   Review of Systems  Respiratory:  Positive for cough and shortness of breath.   All other systems reviewed and are negative.   Physical Exam Updated Vital Signs BP 111/79   Pulse 80   Temp 98.3 F (36.8 C)   Resp (!) 22   Ht 5' (1.524 m)   Wt 95.3 kg   SpO2 95%   BMI 41.01 kg/m  Physical Exam Vitals and nursing note reviewed.  Constitutional:      General: She is not in acute distress.    Appearance: She is well-developed.  HENT:     Head: Normocephalic and atraumatic.     Mouth/Throat:     Mouth: Mucous membranes are moist.  Eyes:     Conjunctiva/sclera: Conjunctivae normal.  Cardiovascular:     Rate and Rhythm: Normal rate.     Heart sounds: No murmur heard. Pulmonary:     Effort: No respiratory distress.     Breath sounds: Rhonchi present.  Abdominal:     Palpations: Abdomen is soft.     Tenderness: There is no abdominal tenderness.  Musculoskeletal:        General: No swelling. Normal range of motion.     Cervical back: Neck supple.  Skin:    General: Skin is warm and dry.     Capillary Refill: Capillary refill takes less than 2 seconds.  Neurological:     Mental Status: She is alert.  Psychiatric:        Mood and Affect: Mood normal.     ED Results / Procedures / Treatments   Labs (all labs ordered are listed, but only abnormal results are displayed) Labs Reviewed  CBC WITH DIFFERENTIAL/PLATELET - Abnormal; Notable for the following components:      Result Value   RDW 16.0 (*)    Platelets 135 (*)    Neutro Abs 1.3 (*)    Lymphs Abs 6.5 (*)    All other components within normal limits  COMPREHENSIVE METABOLIC PANEL - Abnormal; Notable for the following components:    Sodium 134 (*)    Glucose, Bld 103 (*)    BUN 24 (*)    Creatinine, Ser 1.25 (*)    Calcium 8.8 (*)    Albumin 3.3 (*)    GFR, Estimated 44 (*)    All other components within normal limits    EKG None  Radiology DG Chest Port 1 View  Result Date: 04/26/2023 CLINICAL DATA:  COVID infection. EXAM: PORTABLE CHEST 1 VIEW COMPARISON:  04/23/2023 and 08/12/2018 FINDINGS: Mild cardiomegaly and ectasia of the thoracic aorta are stable in appearance. 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 Low lung volumes are seen however both lungs are clear. IMPRESSION: No active lung disease. 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 Consider chest CT with contrast for further evaluation. Electronically Signed   By: Danae Orleans M.D.   On: 04/26/2023 10:27    Procedures Procedures    Medications Ordered in ED Medications  acetaminophen (TYLENOL) tablet 650 mg (650 mg Oral Given 04/26/23 1108)  sodium chloride 0.9 % bolus 500 mL (500 mLs Intravenous New Bag/Given 04/26/23 1155)    ED Course/ Medical Decision Making/ A&P                             Medical Decision Making Patient had increasing shortness of breath chest pain and cough at home today EMS found patient to have an oxygen of 86% patient was recently diagnosed with COVID  Amount and/or Complexity of Data Reviewed Labs: ordered. Decision-making details documented in ED Course.    Details: Labs ordered reviewed and interpreted.  BUN elevated to 25 creatinine is 1.24 Radiology: ordered and independent interpretation performed. Decision-making details documented in ED Course.    Details: Chest x-ray shows he is spinal mass that is stable from previous chest x-rays.  Risk OTC drugs. Risk Details: Patient wants to go home patient's family is concerned because of her shortness of breath.  I  attempted to ambulate patient.  Patient's oxygen saturation dropped to 86%.  Patient has mild AKI.  I will talk with unassigned medicine for admission           Final Clinical Impression(s) / ED Diagnoses Final diagnoses:  COVID  Hypoxia  Acute kidney injury [facility])    Rx / DC Orders ED Discharge Orders     None         [place],  Lonia Skinner, PA-C 04/26/23 1228    Margarita Grizzle, MD 04/26/23 [phone]

## 2023-04-26 NOTE — Assessment & Plan Note (Addendum)
Cr elevated to 1.25, baseline ~0.9-1.0.  Likely prerenal in setting of decreased p.o.  - s/p 500cc bolus in ED - Tolerating PO so will hold IVF -Holding home lisinopril

## 2023-04-26 NOTE — ED Triage Notes (Signed)
Pt brought in by EMS from home; was here and dx with Covid 2 days ago. Has been having increase cough and R sided chest pain with coughing, pain is reproducable. Pt denies SOB, audibly congested. Upon EMS arrival pt was 88% on RA, placed on 2L and incremented to 97%. Upon arrival to ED pt is 98% on RA.  Pt currently denies pain

## 2023-04-26 NOTE — Assessment & Plan Note (Signed)
Platelets stably low, this appears to be chronic.  Could also be a component of viral suppression.  No concern for active bleeding. - CTM

## 2023-04-26 NOTE — Assessment & Plan Note (Addendum)
Much improved from respiratory standpoint. RA for >24 hours.  - Decadron 6 mg PO daily x 10 days (5/4-5/13) - Remdesivir x3 total days per guidelines (5/4-5/6) - Duonebs, Tylenol  - Standard precautions

## 2023-04-26 NOTE — ED Notes (Signed)
ED TO INPATIENT HANDOFF REPORT  ED Nurse Name and Phone #: Colin Mulders Jerremy Maione   S Name/Age/Gender Audrey Weber 77 y.o. female Room/Bed: 025C/025C  Code Status   Code Status: DNR  Home/SNF/Other Home Patient oriented to: self Is this baseline? Yes   Triage Complete: Triage complete  Chief Complaint COVID [U07.1]  Triage Note Pt brought in by EMS from home; was here and dx with Covid 2 days ago. Has been having increase cough and R sided chest pain with coughing, pain is reproducable. Pt denies SOB, audibly congested. Upon EMS arrival pt was 88% on RA, placed on 2L and incremented to 97%. Upon arrival to ED pt is 98% on RA.  Pt currently denies pain   Allergies No Known Allergies  Level of Care/Admitting Diagnosis ED Disposition     ED Disposition  Admit   Condition  --   Comment  Hospital Area: [facility] [[id number]]  Level of Care: Med-Surg [16]  May place patient in observation at [facility] or [facility] if equivalent level of care is available:: No  Covid Evaluation: Confirmed COVID Positive  Diagnosis: COVID [9604540]  Admitting Physician: Lincoln Brigham [[id number]]  Attending Physician: Westley Chandler [[id number]]          B Medical/Surgery History Past Medical History:  Diagnosis Date   Hypertension    Past Surgical History:  Procedure Laterality Date   ABDOMINAL HYSTERECTOMY     IR GASTROSTOMY TUBE REMOVAL  11/20/2021     A IV Location/Drains/Wounds Patient Lines/Drains/Airways Status     Active Line/Drains/Airways     Name Placement date Placement time Site Days   Peripheral IV 04/26/23 20 G Distal;Left;Posterior Forearm 04/26/23  0933  Forearm  less than 1            Intake/Output Last 24 hours  Intake/Output Summary (Last 24 hours) at 04/26/2023 1500 Last data filed at 04/26/2023 1406 Gross per 24 hour  Intake 500 ml  Output --  Net 500 ml    Labs/Imaging Results for orders placed or performed during the hospital  encounter of 04/26/23 (from the past 48 hour(s))  CBC with Differential     Status: Abnormal   Collection Time: 04/26/23  9:32 AM  Result Value Ref Range   WBC 8.5 4.0 - 10.5 K/uL   RBC 4.29 3.87 - 5.11 MIL/uL   Hemoglobin 12.4 12.0 - 15.0 g/dL   HCT 13.0 86.5 - 78.4 %   MCV 93.0 80.0 - 100.0 fL   MCH 28.9 26.0 - 34.0 pg   MCHC 31.1 30.0 - 36.0 g/dL   RDW 69.6 (H) 29.5 - 28.4 %   Platelets 135 (L) 150 - 400 K/uL    Comment: REPEATED TO VERIFY   nRBC 0.0 0.0 - 0.2 %   Neutrophils Relative % 15 %   Neutro Abs 1.3 (L) 1.7 - 7.7 K/uL   Lymphocytes Relative 77 %   Lymphs Abs 6.5 (H) 0.7 - 4.0 K/uL   Monocytes Relative 8 %   Monocytes Absolute 0.6 0.1 - 1.0 K/uL   Eosinophils Relative 0 %   Eosinophils Absolute 0.0 0.0 - 0.5 K/uL   Basophils Relative 0 %   Basophils Absolute 0.0 0.0 - 0.1 K/uL   Immature Granulocytes 0 %   Abs Immature Granulocytes 0.01 0.00 - 0.07 K/uL    Comment: Performed at [facility], [address], [place], [place] [address]  Comprehensive metabolic panel     Status: Abnormal  Collection Time: 04/26/23  9:32 AM  Result Value Ref Range   Sodium 134 (L) 135 - 145 mmol/L   Potassium 4.2 3.5 - 5.1 mmol/L   Chloride 99 98 - 111 mmol/L   CO2 25 22 - 32 mmol/L   Glucose, Bld 103 (H) 70 - 99 mg/dL    Comment: Glucose reference range applies only to samples taken after fasting for at least 8 hours.   BUN 24 (H) 8 - 23 mg/dL   Creatinine, Ser 6.04 (H) 0.44 - 1.00 mg/dL   Calcium 8.8 (L) 8.9 - 10.3 mg/dL   Total Protein 8.1 6.5 - 8.1 g/dL   Albumin 3.3 (L) 3.5 - 5.0 g/dL   AST 37 15 - 41 U/L   ALT 19 0 - 44 U/L   Alkaline Phosphatase 75 38 - 126 U/L   Total Bilirubin 0.6 0.3 - 1.2 mg/dL   GFR, Estimated 44 (L) >60 mL/min    Comment: (NOTE) Calculated using the CKD-EPI Creatinine Equation (2021)    Anion gap 10 5 - 15    Comment: Performed at [facility], [address] [place], [place] [address]  Basic metabolic panel     Status: Abnormal    Collection Time: 04/26/23  2:15 PM  Result Value Ref Range   Sodium 136 135 - 145 mmol/L   Potassium 4.3 3.5 - 5.1 mmol/L   Chloride 104 98 - 111 mmol/L   CO2 22 22 - 32 mmol/L   Glucose, Bld 102 (H) 70 - 99 mg/dL    Comment: Glucose reference range applies only to samples taken after fasting for at least 8 hours.   BUN 22 8 - 23 mg/dL   Creatinine, Ser 1.19 (H) 0.44 - 1.00 mg/dL   Calcium 8.4 (L) 8.9 - 10.3 mg/dL   GFR, Estimated 50 (L) >60 mL/min    Comment: (NOTE) Calculated using the CKD-EPI Creatinine Equation (2021)    Anion gap 10 5 - 15    Comment: Performed at [facility], [address] [place], [place] [address]   DG Chest Port 1 View  Result Date: 04/26/2023 CLINICAL DATA:  COVID infection. EXAM: PORTABLE CHEST 1 VIEW COMPARISON:  04/23/2023 and 08/12/2018 FINDINGS: Mild cardiomegaly and ectasia of the thoracic aorta are stable in appearance. 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 Low lung volumes are seen however both lungs are clear. IMPRESSION: No active lung disease. 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 Consider chest CT with contrast for further evaluation. Electronically Signed   By: Danae Orleans M.D.   On: 04/26/2023 10:27    Pending Labs Unresulted Labs (From admission, onward)     Start     Ordered   04/26/23 1343  TSH  Once,   R        04/26/23 1344            Vitals/Pain Today's Vitals   04/26/23 1152 04/26/23 1155 04/26/23 1200 04/26/23 1400  BP:    119/79  Pulse:   80 76  Resp:   (!) 22 20  Temp:  98.5 F (36.9 C)    TempSrc:  Oral    SpO2: (!) 87%  95% 94%  Weight:      Height:      PainSc:        Isolation Precautions Airborne and Contact precautions  Medications Medications  acetaminophen (TYLENOL) tablet 650 mg (650 mg Oral Given 04/26/23 1108)  metoprolol tartrate  (LOPRESSOR) tablet 37.5 mg (37.5 mg Oral Given 04/26/23 1412)  simvastatin (ZOCOR) tablet 40 mg (has no administration in time range)  sertraline (ZOLOFT) tablet 50 mg (50 mg Oral Given 04/26/23 1412)  famotidine (PEPCID) tablet 20 mg (20 mg Oral Given 04/26/23 1412)  apixaban (ELIQUIS) tablet 5 mg (5 mg Oral Given 04/26/23 1412)  dexamethasone (DECADRON) tablet 6 mg (6 mg Oral Given 04/26/23 1412)  remdesivir 200 mg in sodium chloride 0.9% 250 mL IVPB (has no administration in time range)    Followed by  remdesivir 100 mg in sodium chloride 0.9 % 100 mL IVPB (has no administration in time range)  ipratropium-albuterol (DUONEB) 0.5-2.5 (3) MG/3ML nebulizer solution 3 mL (has no administration in time range)  sodium chloride 0.9 % bolus 500 mL (0 mLs Intravenous Stopped 04/26/23 1406)    Mobility walks with device     Focused Assessments Pulmonary Assessment Handoff:  Lung sounds: Bilateral Breath Sounds: Diminished, Expiratory wheezes L Breath Sounds: Diminished, Expiratory wheezes R Breath Sounds: Diminished, Expiratory wheezes O2 Device: Room Air      R Recommendations: See Admitting Provider Note  Report given to:   Additional Notes: pt has a very slow gait and somewhat drags her R leg; reported by family that this is normal for her

## 2023-04-26 NOTE — H&P (Signed)
Hospital Admission History and Physical Service Pager: [phone]  Patient name: Audrey Weber Medical record number: [id number] Date of Birth: 27-Mar-1946 Age: 77 y.o. Gender: female  Primary Care Provider: Inc, [facility] Consultants: none Code Status: DNR/DNI which was confirmed with family Preferred Emergency Contact: Daughters- Maxine Glenn [phone], Bronson Ing  Chief Complaint: SOB  Assessment and Plan: Ammy Mcelveen is a 77 y.o. female presenting with SOB in setting of COVID infection causing hypoxia w/ exertion. Differential includes COVID infection, COPD/asthma, PNA, PE. Covid is most likely given confirmed recent covid infection, lymphocytosis, and relatively mild respiratory symptoms (satting well at rest on RA). COPD/asthma is considered given wheezing on exam, but pt does not have hx of either. Will add duonebs for now, but can stop if no benefit noted. PNA is considered, but less likely given lack of focal lung findings on exam or imaging. PE is considered given hx of PE, but less likely given normal HR.   * COVID - Admitted to [facility] Med Surg w/ attending Dr. Manson Passey - Start Decadron 6mg  PO daily x10 day course (per COVID protocol) - Start Remdesivir  - Start Duonebs q6h. Can stop if minimal benefit. - Stop molnupiravir - Tylenol prn for fever - On RA, maintain O2 >92% - Airborne and contact precautions  Thrombocytopenia (HCC) Platelets stably low, this appears to be chronic.  Could also be a component of viral suppression.  No concern for active bleeding. - CTM  AKI (acute kidney injury) (HCC) Cr elevated to 1.25, baseline ~0.9-1.0.  Likely prerenal in setting of decreased p.o.  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  Physical exam did not show goiter, also location of mass on imaging is less consistent with  thyroid. - f/u TSH. TSH was normal 1 yr ago.  -Consider CT when AKI resolves. Could also consider thyroid U/S.      FEN/GI: Regular VTE Prophylaxis: Eliquis 5 mg BID  Disposition: Med-tele  History of Present Illness:  Audrey Weber is a 77 y.o. female presenting with SOB in setting of COVID infection. Pt was previously seen in ED 2 days ago w/ cough and congestion and fever, found to have COVID. Pt was prescribed Paxlovid by ED, but then instructed by PCP to switch molnupiravir due to concern for drug interactions.  She only took 1 dose yesterday because it had to be mailed to her.  She says she had trouble with pain in her throat/right-sided chest after trying to take 4 pills, which has since resolved.  Daughter Charity fundraiser and lives with pt) stated that she started to have worsening SOB, was satting in upper 90's last night. Then this morning, was satting 90% on room air so daughter called EMS. She noticed she was also having "rattling" and increased work of breathing.  Denies fever, diarrhea, vomiting, chest pain, decreased p.o. Pt reports eating 3 meals yesterday, but less PO than usual.   ED Course: In ED, pt VSS.  CXR showed no acute active lung disease.  Patient satting well on room air at rest, however desats to 80s when coughing or trying to ambulate. Pt given 500cc IVF bolus.   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   Pertinent Past Surgical History: Abdominal hysterectomy  Remainder reviewed in history tab.  Pertinent Social History: Tobacco use: No Alcohol use: no Other Substance use: no Lives with Bronson Ing (daughter)  Pertinent Family History: No pertinent family hx  Remainder reviewed in history tab.   Important Outpatient Medications: Eliquis 5 BID Pepcid 20mg  BID Metoprolol 37.5 BID Zoloft 50 daily Simvastatin 40mg  nightly Aricept 5mg  nightly Lisinopril  5mg  daily  Remainder reviewed in medication history.   Objective: BP 119/79   Pulse 76   Temp 98.5 F (36.9 C) (Oral)   Resp 20   Ht 5' (1.524 m)   Wt 95.3 kg   SpO2 94%   BMI 41.01 kg/m  Exam: General: Alert, pleasant older woman laying in bed. NAD.  HEENT: NCAT. MMM.  Neck: No palpated goiter or other neck masses.  Cardiovascular: RRR, no murmurs. Cap refill <2. Respiratory: Diffuse mild end-expiratory wheezing (vs transmitted upper airway sounds) in all lung fields. Good air movement throughout. No crackles. Normal WOB on RA while at rest.  Gastrointestinal: Soft, nontender, nondistended. Normal BS.  Derm: Warm, well perfused.  Labs:  CBC BMET  Recent Labs  Lab 04/26/23 0932  WBC 8.5  HGB 12.4  HCT 39.9  PLT 135*   Recent Labs  Lab 04/26/23 0932  NA 134*  K 4.2  CL 99  CO2 25  BUN 24*  CREATININE 1.25*  GLUCOSE 103*  CALCIUM 8.8*     EKG: My own interpretation: - NSR with L axis deviation  Imaging Studies Performed:  CXR - No active lung disease.  - 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 Consider chest CT with contrast for further evaluation.   My interpretation: Tracheal deviation to R appears stable compared to prior CXR. Exam does not correlate with goiter.   Lincoln Brigham, MD 04/26/2023, 2:43 PM PGY-1, [facility]  FPTS Intern pager: [phone], text pages welcome Secure chat group [facility] Teaching Service

## 2023-04-26 NOTE — ED Notes (Signed)
Pt ambulated with a walker for pulsox trial; oxygen dropped as low as 87% with associated increased work of breathing. Provider notified. Pt placed back in bed and oxygen began to increment to the mid 90's.

## 2023-04-27 ENCOUNTER — Encounter (HOSPITAL_COMMUNITY): Payer: Self-pay | Admitting: Family Medicine

## 2023-04-27 DIAGNOSIS — U071 COVID-19: Secondary | ICD-10-CM | POA: Diagnosis not present

## 2023-04-27 LAB — BASIC METABOLIC PANEL
Anion gap: 5 (ref 5–15)
BUN: 24 mg/dL — ABNORMAL HIGH (ref 8–23)
CO2: 23 mmol/L (ref 22–32)
Calcium: 8.8 mg/dL — ABNORMAL LOW (ref 8.9–10.3)
Chloride: 106 mmol/L (ref 98–111)
Creatinine, Ser: 1.13 mg/dL — ABNORMAL HIGH (ref 0.44–1.00)
GFR, Estimated: 49 mL/min — ABNORMAL LOW (ref >60)
Glucose, Bld: 136 mg/dL — ABNORMAL HIGH (ref 70–99)
Potassium: 4.8 mmol/L (ref 3.5–5.1)
Sodium: 134 mmol/L — ABNORMAL LOW (ref 135–145)

## 2023-04-27 LAB — CBC
HCT: 39.2 % (ref 36.0–46.0)
Hemoglobin: 12.4 g/dL (ref 12.0–15.0)
MCH: 29 pg (ref 26.0–34.0)
MCHC: 31.6 g/dL (ref 30.0–36.0)
MCV: 91.6 fL (ref 80.0–100.0)
Platelets: 141 10*3/uL — ABNORMAL LOW (ref 150–400)
RBC: 4.28 MIL/uL (ref 3.87–5.11)
RDW: 15.5 % (ref 11.5–15.5)
WBC: 6 10*3/uL (ref 4.0–10.5)
nRBC: 0 % (ref 0.0–0.2)

## 2023-04-27 LAB — TROPONIN I (HIGH SENSITIVITY)
Troponin I (High Sensitivity): 5 ng/L (ref <18)
Troponin I (High Sensitivity): 8 ng/L (ref <18)

## 2023-04-27 LAB — PROCALCITONIN: Procalcitonin: <0.1 ng/mL

## 2023-04-27 NOTE — Discharge Summary (Addendum)
Family Medicine Teaching [facility] Discharge Summary  Patient name: Audrey Weber Medical record number: [id number] Date of birth: 1946/02/18 Age: 77 y.o. Gender: female Date of Admission: 04/26/2023  Date of Discharge: 04/27/23 Admitting Physician: Lincoln Brigham, MD  Primary Care Provider: [facility] Consultants: None  Indication for Hospitalization: Respiratory distress 2/2 COVID  Discharge Diagnoses/Problem List:  Principal Problem:   COVID Active Problems:   Mediastinal mass   AKI (acute kidney injury) (HCC)   Thrombocytopenia (HCC)  Disposition: Home with daughter as caregiver   Discharge Condition: Stable   Discharge Exam:  Blood pressure 122/80, pulse 69, temperature 98.1 F (36.7 C), resp. rate 18, height 5' (1.524 m), weight 95.3 kg, SpO2 100 %. General: In no apparent distress Heart: Regular rate and rhythm with no murmurs appreciated Lungs: CTA bilaterally, no wheezing, TTP over left lower chest wall into abdomen  Abdomen: Bowel sounds present Skin: Warm and dry Extremities: Moving all extremities   Brief Hospital Course:  Audrey Weber is a 77 y.o.female with a history of H/o PE, cognitive impairment, HTN, and CVA who was admitted to the [facility] Teaching Service at [facility] for respiratory distress in the setting of COVID infection. Her hospital course is detailed below:  COVID Required oxygen for small amount of time on admission. Soon weaned to RA and was without oxygen need prior to disposition. She is not ambulatory at baseline and utilizes wheelchair, did not have desaturations with transitions of movement. Kept patient on Remdesivir + Decadron inpatient but discontinued once without oxygen on discharge. Normal PO intake. Procal and troponins negative. Patient in stable condition and tolerated RA >24 hours prior to discharge. Ensured safe discharge by speaking to daughter to ensure she is able to care for patient at home.   Mediastinal  Mass  Tracheal Deviation Present on CXR. Needs follow up imaging. No swallowing difficulties inpatient, present for ~4 years.  HTN Normotensive in hospital with bump in Creatinine. Hold on lisinopril 5mg  until sees PCP. Continue home metoprolol  Other chronic conditions were medically managed with home medications and formulary alternatives as necessary (Hypothyroidism, thrombocytopenia, HLD)  PCP Follow-up Recommendations:  F/u mediastinal mass, needs further work up--may need CT Restarted home Lisinopril 5 mg, Cr needs to be rechecked  Repeat BMP and CBC Monitor dyspnea and symptoms on follow up TSH elevated, increased Levothyroxine-repeat in 4 weeks     Significant Procedures: None  Significant Labs and Imaging:  Recent Labs  Lab 04/24/23 0103 04/26/23 0932 04/27/23 0328  WBC 8.1 8.5 6.0  HGB 12.9 12.4 12.4  HCT 40.6 39.9 39.2  PLT 119* 135* 141*   Recent Labs  Lab 04/24/23 0103 04/26/23 0932 04/26/23 1415 04/27/23 0328  NA 136 134* 136 134*  K 5.1 4.2 4.3 4.8  CL 101 99 104 106  CO2 28 25 22 23   GLUCOSE 99 103* 102* 136*  BUN 23 24* 22 24*  CREATININE 1.03* 1.25* 1.12* 1.13*  CALCIUM 9.6 8.8* 8.4* 8.8*  ALKPHOS  --  75  --   --   AST  --  37  --   --   ALT  --  19  --   --   ALBUMIN  --  3.3*  --   --     Trop 5>8 ProCal negative   DG Chest Port 1 View  Result Date: 04/26/2023 CLINICAL DATA:  COVID infection. EXAM: PORTABLE CHEST 1 VIEW COMPARISON:  04/23/2023 and 08/12/2018 FINDINGS: Mild cardiomegaly and ectasia  of the thoracic aorta are stable in appearance. 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 Low lung volumes are seen however both lungs are clear. IMPRESSION: No active lung disease. 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 Consider chest CT with contrast for further evaluation. Electronically  Signed   By: Danae Orleans M.D.   On: 04/26/2023 10:27     Results/Tests Pending at Time of Discharge: None   Discharge Medications:  Allergies as of 04/27/2023   No Known Allergies      Medication List     STOP taking these medications    lisinopril 5 MG tablet Commonly known as: ZESTRIL   Paxlovid (300/100) 20 x 150 MG & 10 x 100MG  Tbpk Generic drug: nirmatrelvir & ritonavir       TAKE these medications    apixaban 5 MG Tabs tablet Commonly known as: ELIQUIS Take 5 mg by mouth 2 (two) times daily.   donepezil 5 MG tablet Commonly known as: ARICEPT Take 5 mg by mouth at bedtime.   famotidine 20 MG tablet Commonly known as: PEPCID Take 20 mg by mouth 2 (two) times daily.   Metoprolol Tartrate 37.5 MG Tabs Take 1 tablet by mouth in the morning and at bedtime.   sertraline 50 MG tablet Commonly known as: ZOLOFT Take 50 mg by mouth daily.   simvastatin 40 MG tablet Commonly known as: ZOCOR Take 40 mg by mouth every evening.        Discharge Instructions: Please refer to Patient Instructions section of EMR for full details.  Patient was counseled important signs and symptoms that should prompt return to medical care, changes in medications, dietary instructions, activity restrictions, and follow up appointments.   Follow-Up Appointments:  Follow-up [employer], [address] [facility]. Schedule an appointment as soon as possible for a visit in 1 week(s).   Contact information: [address] [place] [address] [phone]                 Alfredo Martinez, MD 04/27/2023, 3:09 PM PGY-2, [facility]

## 2023-04-27 NOTE — Hospital Course (Addendum)
Audrey Weber is a 77 y.o.female with a history of H/o PE, cognitive impairment, HTN, and CVA who was admitted to the [facility] Teaching Service at [facility] for respiratory distress in the setting of COVID infection. Her hospital course is detailed below:  COVID Required oxygen for small amount of time on admission. Soon weaned to RA and was without oxygen need prior to disposition. She is not ambulatory at baseline and utilizes wheelchair, did not have desaturations with transitions of movement. Kept patient on Remdesivir + Decadron inpatient but discontinued once without oxygen on discharge. Normal PO intake. Procal and troponins negative. Patient in stable condition and tolerated RA >24 hours prior to discharge. Ensured safe discharge by speaking to daughter to ensure she is able to care for patient at home.   Mediastinal Mass  Tracheal Deviation Present on CXR. Needs follow up imaging. No swallowing difficulties inpatient, present for ~4 years.  HTN Normotensive in hospital with bump in Creatinine. Hold on lisinopril 5mg  until sees PCP. Continue home metoprolol  Other chronic conditions were medically managed with home medications and formulary alternatives as necessary (Hypothyroidism, thrombocytopenia, HLD)  PCP Follow-up Recommendations:  F/u mediastinal mass, needs further work up--may need CT Restarted home Lisinopril 5 mg, Cr needs to be rechecked  Repeat BMP and CBC Monitor dyspnea and symptoms on follow up TSH elevated, increased Levothyroxine-repeat in 4 weeks

## 2023-04-27 NOTE — TOC Transition Note (Addendum)
Transition of Care [facility]) - CM/SW Discharge Note   Patient Details  Name: Audrey Weber MRN: [id number] Date of Birth: [birth year]  Transition of Care [facility]) CM/SW Contact:  Lawerance Sabal, RN Phone Number: 04/27/2023, 2:55 PM   Clinical Narrative:     Sherron Monday w patient's daughter over the phone.  She states that her sister is on her way to the hospital and she will be able to bring the patient home by car.  She denied any further needs for home.  16:30 Patient's daughter called back to say that they cannot get her and will need PTAR. [facility] does not have transportation this weekend.  Address verified.  Updated nurse by secure chat and requested that she verify DNR forms are on chart for PTAR.  PTAR forms tubed to unit and PTAR called.           Patient Goals and CMS Choice      Discharge Placement                         Discharge Plan and Services Additional resources added to the After Visit Summary for                                       Social Determinants of Health (SDOH) Interventions SDOH Screenings   Food Insecurity: No Food Insecurity (04/26/2023)  Housing: Low Risk  (04/26/2023)  Transportation Needs: No Transportation Needs (04/26/2023)  Utilities: Not At Risk (04/26/2023)  Tobacco Use: Low Risk  (04/27/2023)     Readmission Risk Interventions     No data to display

## 2023-04-27 NOTE — Progress Notes (Signed)
     Daily Progress Note Intern Pager: [phone]  Patient name: Audrey Weber Medical record number: [id number] Date of birth: 1946-01-03 Age: 77 y.o. Gender: female  Primary Care Provider: [facility], [facility] Consultants: None  Code Status: DNR/DNI  Pt Overview and Major Events to Date:  04/26/23: FPTS Admission   Assessment and Plan:  [name] is a 77 y.o. female p/f temporary oxygen requirement 2/2 COVID. 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   * COVID Much improved from respiratory standpoint. RA for >24 hours.  - Decadron 6 mg PO daily x 10 days (5/4-5/13) - Remdesivir x3 total days per guidelines (5/4-5/6) - Duonebs, Tylenol  - Standard precautions    Thrombocytopenia (HCC) Platelets stable, continue monitoring.    AKI (acute kidney injury) (HCC) Cr elevated to 1.13, baseline ~0.9-1.0.  Stable. Encourage PO.  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 TSH nml. Needs further imaging (CT vs U/S depending on renal function). Not urgent, could continue inpatient vs outpatient.     FEN/GI: Regular  PPx: Eliquis  Dispo:Home  likely today or tomorrow . Barriers include further treatment and monitoring for oxygen need. Lives with daughter.   Subjective:  Patient is doing well, reports of pleuritic pain in the lower left. Otherwise, really wants to go home.   Objective: Temp:  [98.1 F (36.7 C)-98.7 F (37.1 C)] 98.1 F (36.7 C) (05/05 0737) Pulse Rate:  [68-95] 68 (05/05 0737) Resp:  [16-22] 18 (05/05 0737) BP: (111-126)/(76-94) 122/80 (05/05 0737) SpO2:  [87 %-100 %] 100 % (05/05 0840) General: Alert and oriented in no apparent distress Heart: Regular rate and rhythm with no murmurs appreciated Lungs: CTA bilaterally, no wheezing, TTP over left lower chest wall into abdomen  Abdomen: Bowel sounds present Skin: Warm and  dry Extremities: Moving all extremities    Laboratory: Most recent CBC Lab Results  Component Value Date   WBC 6.0 04/27/2023   HGB 12.4 04/27/2023   HCT 39.2 04/27/2023   MCV 91.6 04/27/2023   PLT 141 (L) 04/27/2023   Most recent BMP    Latest Ref Rng & Units 04/27/2023    3:28 AM  BMP  Glucose 70 - 99 mg/dL 147   BUN 8 - 23 mg/dL 24   Creatinine 8.29 - 1.00 mg/dL 5.62   Sodium 130 - 865 mmol/L 134   Potassium 3.5 - 5.1 mmol/L 4.8   Chloride 98 - 111 mmol/L 106   CO2 22 - 32 mmol/L 23   Calcium 8.9 - 10.3 mg/dL 8.8      Alfredo Martinez, MD 04/27/2023, 9:28 AM  PGY-2, [place] [facility] FPTS Intern pager: [phone], text pages welcome Secure chat group [facility] Teaching Service

## 2023-04-27 NOTE — Discharge Instructions (Addendum)
Dear Audrey Weber,   Thank you so much for allowing [place] to be part of your care!  You were admitted to [facility] for COVID infection. We treated you with steroids and anti-viral medications. You tolerated room air for quite some time before discharge. We made sure you did not need any oxygen with walking as well. Continue with current medications at home. You will need to see your primary care provider in about 1 week from discharge.    POST-HOSPITAL & CARE INSTRUCTIONS Follow up with primary care doctor on discharge.  Please let PCP/Specialists know of any changes that were made.  Please see medications section of this packet for any medication changes.   DOCTOR'S APPOINTMENT & FOLLOW UP CARE INSTRUCTIONS  Make appt with primary care doctor in 1 week   RETURN PRECAUTIONS:  Return if you experience shortness of breath, confusion, difficulty with speaking/walking, chest pain, leg swelling.    Take care and be well!  Family Medicine Teaching Service  [place]  [facility]  [address] [place], [place] [address] [phone]

## 2023-04-27 NOTE — Plan of Care (Signed)

## 2023-09-25 ENCOUNTER — Other Ambulatory Visit (HOSPITAL_COMMUNITY): Payer: Self-pay | Admitting: *Deleted

## 2023-09-25 DIAGNOSIS — R1310 Dysphagia, unspecified: Secondary | ICD-10-CM

## 2023-09-26 ENCOUNTER — Ambulatory Visit
Admission: RE | Admit: 2023-09-26 | Discharge: 2023-09-26 | Disposition: A | Discharge status: Home / Self Care | Payer: Medicare (Managed Care) | Source: Ambulatory Visit

## 2023-09-26 ENCOUNTER — Ambulatory Visit: Payer: Medicare (Managed Care)

## 2023-09-26 ENCOUNTER — Other Ambulatory Visit: Payer: Self-pay

## 2023-09-26 DIAGNOSIS — M545 Low back pain, unspecified: Secondary | ICD-10-CM

## 2023-09-26 DIAGNOSIS — M25552 Pain in left hip: Secondary | ICD-10-CM

## 2023-10-03 ENCOUNTER — Ambulatory Visit (HOSPITAL_COMMUNITY)
Admission: RE | Admit: 2023-10-03 | Discharge: 2023-10-03 | Disposition: A | Discharge status: Home / Self Care | Payer: Medicare (Managed Care) | Source: Ambulatory Visit

## 2023-10-03 DIAGNOSIS — R1310 Dysphagia, unspecified: Secondary | ICD-10-CM

## 2023-10-03 DIAGNOSIS — Z8673 Personal history of transient ischemic attack (TIA), and cerebral infarction without residual deficits: Secondary | ICD-10-CM | POA: Insufficient documentation

## 2023-10-03 NOTE — Evaluation (Signed)
Modified Barium Swallow Study  Patient Details  Name: Audrey Weber MRN: [id number] Date of Birth: 11-Feb-1946  Today's Date: 10/03/2023  Modified Barium Swallow completed.  Full report located under Chart Review in the Imaging Section.  History of Present Illness Pt is a 77 yo presenting for OP MBS due to reports of coughing episodes that can occur with and without PO intake. Daughter reports that pt had a PEG ~2 years ago after removal of liver mass and was dependent on this for several months before gradually being able to reintroduce POs. She says she has had swallow studies (these are not available in her chart for me to review) and that most recently she has been eating bite-sized pieces of food and drinking thin liquids. PMH includes: stroke (2007), hepatocellular carcinoma, mediastinal mass, HTN, PE   Clinical Impression Pt's study was mildly abbreviated due to reports of nausea. She had sometimes prolonged oral holding but question the impact of taste as pt really did not like the barium. She had more of a munching pattern of mastication and her posterior transit was repetitive/disorganized. Pharyngeally she seemed to have reduced hyoid excursion and epiglottic inversion, but she still protected her airway well across most trials. She had a single instance of sensed aspiration when there was impaired timing of airway closure with a cup sip of thin liquid (unable to visualize well around shoulder to determine if this ejected the aspirate). Question if pt may have a pharyngocele in which thinner barium seems to remain after the swallow, emptying then into her pyriform sinuses. She otherwise has a relatively strong swallow response to clear barium (especially solids) from her pharynx well. Given reports of frequent coughing with PO intake, recommend f/u SLP for ongoing clinical assessment and intervention. Aspiration precautions were reviewed with pt and daughter.  Factors that may increase risk of  adverse event in presence of aspiration Audrey Weber & Audrey Weber 2021): Limited mobility;Frail or deconditioned;Reduced cognitive function  Swallow Evaluation Recommendations Recommendations: PO diet PO Diet Recommendation: Dysphagia 3 (Mechanical soft);Thin liquids (Level 0) Liquid Administration via: Cup;Straw Medication Administration: Whole meds with liquid (as tolerated) Supervision: Patient able to self-feed Swallowing strategies  : Slow rate;Small bites/sips (small, controlled sips) Postural changes: Position pt fully upright for meals;Stay upright 30-60 min after meals Oral care recommendations: Oral care BID (2x/day)      Audrey Weber., M.A. CCC-SLP Acute Rehabilitation Services Office [phone]  Secure chat preferred  10/03/2023,2:08 PM

## 2024-01-20 ENCOUNTER — Other Ambulatory Visit: Payer: Self-pay | Admitting: Vascular Surgery

## 2024-01-20 ENCOUNTER — Ambulatory Visit
Admission: RE | Admit: 2024-01-20 | Discharge: 2024-01-20 | Disposition: A | Discharge status: Home / Self Care | Payer: Medicare (Managed Care) | Source: Ambulatory Visit | Attending: Vascular Surgery | Admitting: Vascular Surgery

## 2024-01-20 DIAGNOSIS — R062 Wheezing: Secondary | ICD-10-CM

## 2024-02-09 IMAGING — CT CT ABD-PELV W/ CM
2 of 5 series · 16 of 46 positions shown, 18 images · IV contrast (APPLIED)
Comparison: MRI from 01/10/2022

CLINICAL DATA: Epigastric pain and weakness

EXAM:
CT ABDOMEN AND PELVIS WITH CONTRAST
TECHNIQUE: Multidetector CT imaging of the abdomen and pelvis was performed
using the standard protocol following bolus administration of
intravenous contrast.

[Series 2: abd pel w · axial · 0.86mm/px · z∈[-449,-39]mm · 13 of 92 slices shown, 15 images]
[im 5/92  soft-tissue]
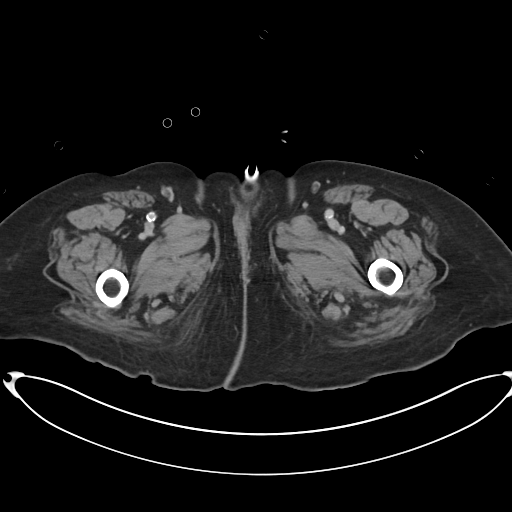
[im 5/92  bone]
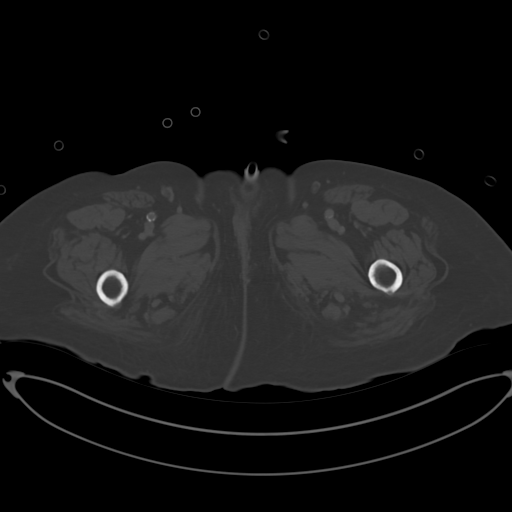
[im 15/92  soft-tissue]
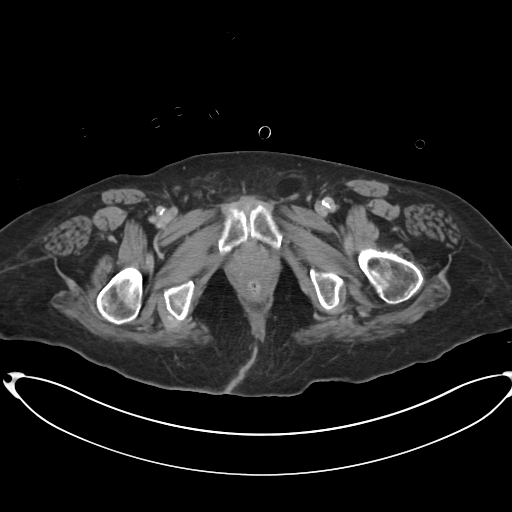
[im 20/92  soft-tissue]
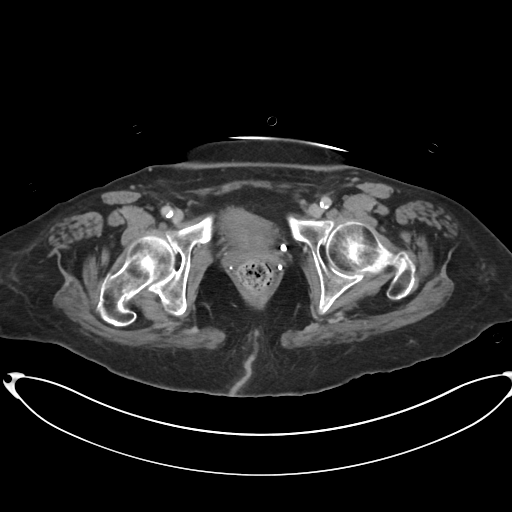
[im 24/92  soft-tissue]
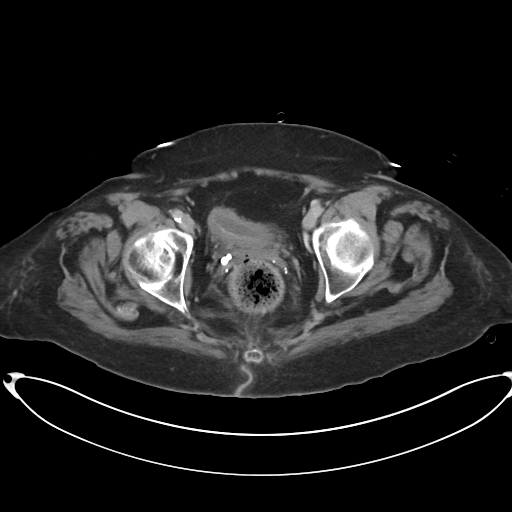
[im 34/92  soft-tissue]
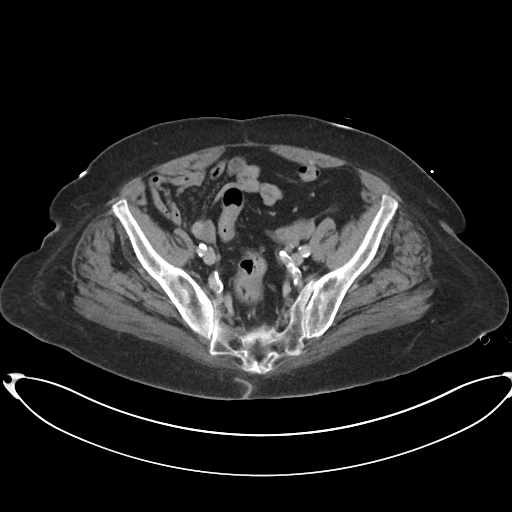
[im 39/92  soft-tissue]
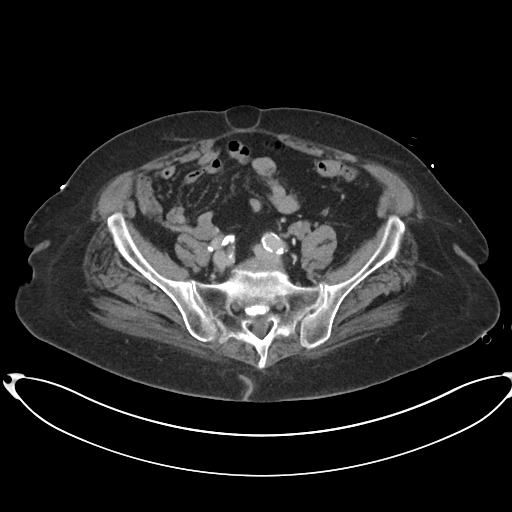
[im 48/92  soft-tissue]
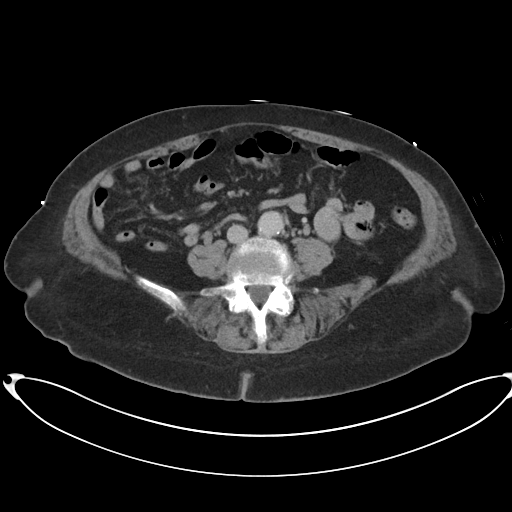
[im 53/92  soft-tissue]
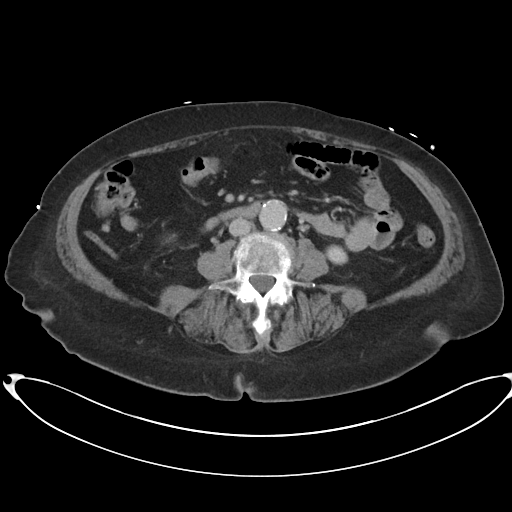
[im 58/92  soft-tissue]
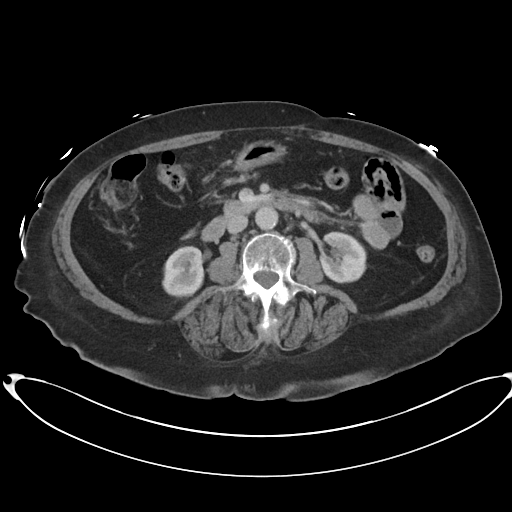
[im 58/92  bone]
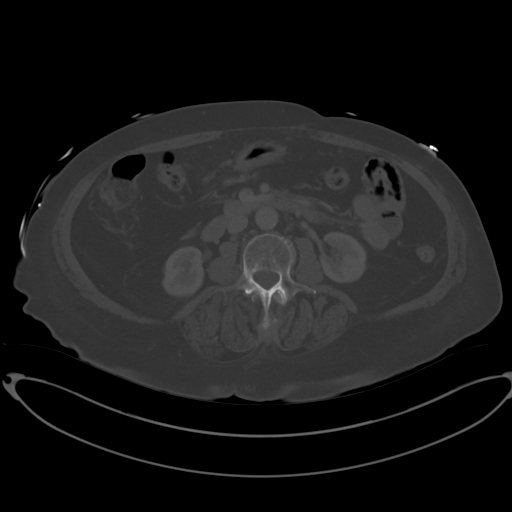
[im 68/92  soft-tissue]
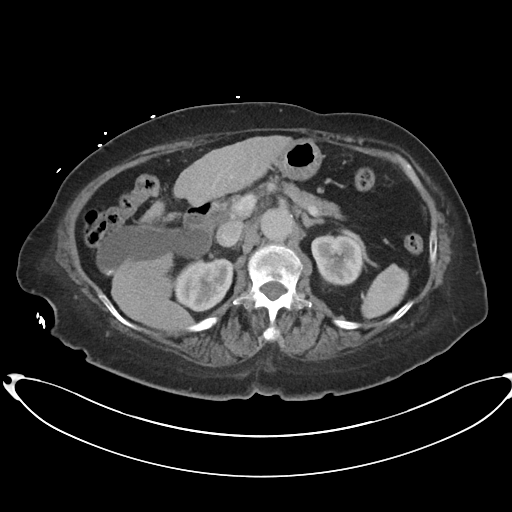
[im 72/92  soft-tissue]
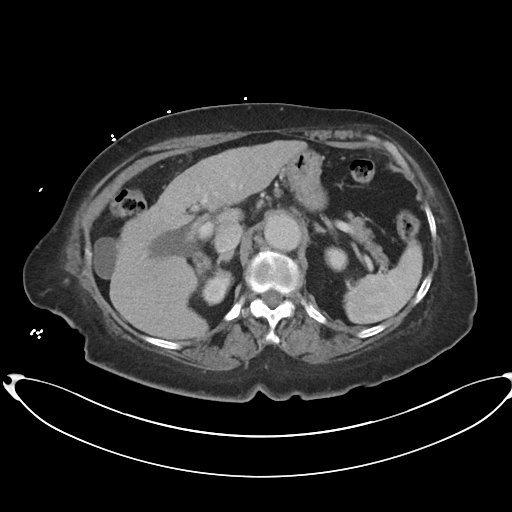
[im 77/92  soft-tissue]
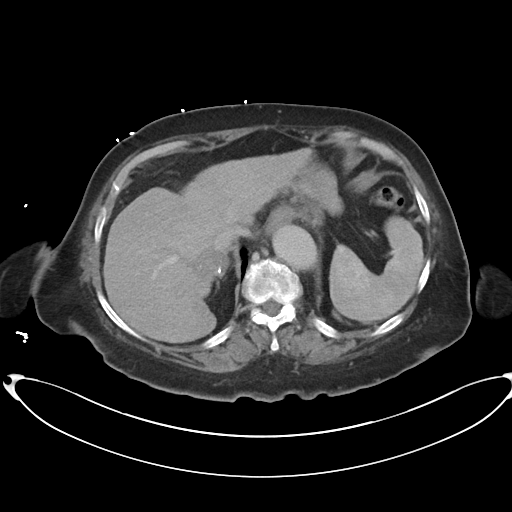
[im 87/92  soft-tissue]
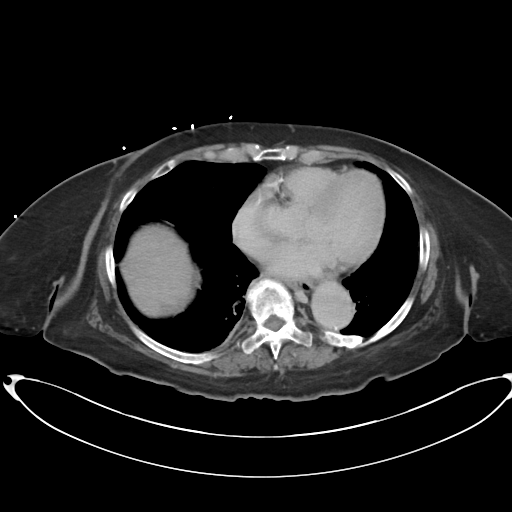

[Series 5: coronal · coronal · 0.94mm/px · 3 of 89 slices shown]
[im 30/89  soft-tissue]
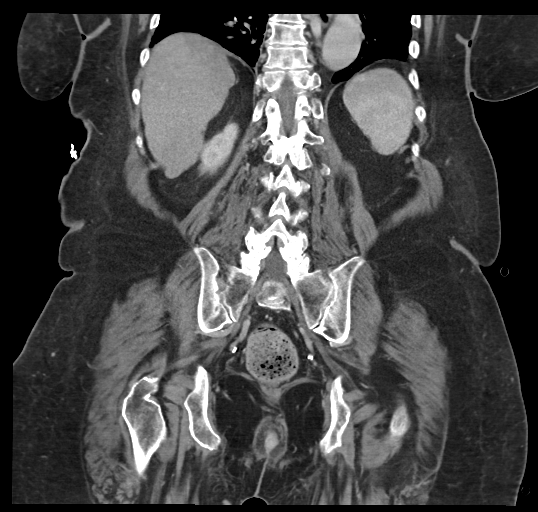
[im 40/89  soft-tissue]
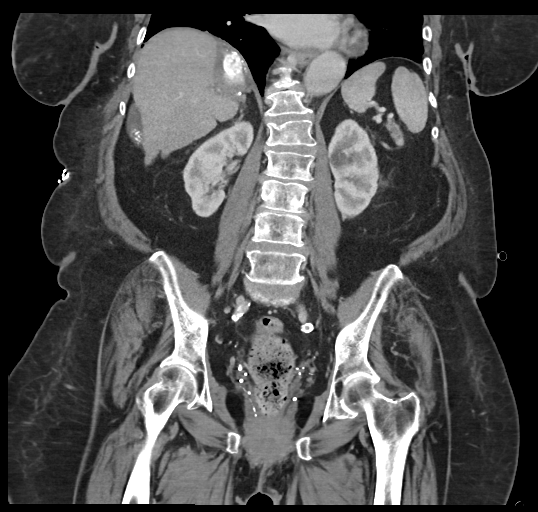
[im 49/89  soft-tissue]
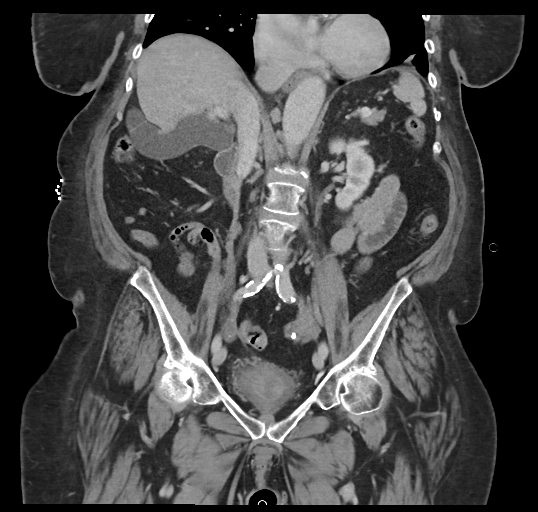

[16 of 46 positions shown; findings below may reference images not displayed]

RADIATION DOSE REDUCTION: This exam was performed according to the
departmental dose-optimization program which includes automated
exposure control, adjustment of the mA and/or kV according to
patient size and/or use of iterative reconstruction technique.

CONTRAST:  80mL OMNIPAQUE IOHEXOL 300 MG/ML  SOLN
FINDINGS: Lower chest: No acute abnormality.

Hepatobiliary: Lesion is noted in the medial aspect of the right
lobe in the dome of the liver similar to that seen on prior MRI
examination. This measures approximately 5.3 cm in greatest
dimension and has been previously treated by history. This accounts
for the increased density of the lesion related to embolotherapy.
Mild nodularity is noted consistent with underlying cirrhosis. No
other focal mass lesion is seen. Gallbladder is well distended.
Dependent cholelithiasis is noted.

.

Pancreas: Unremarkable. No pancreatic ductal dilatation or
surrounding inflammatory changes.

Spleen: Normal in size without focal abnormality.

Adrenals/Urinary Tract: Adrenal glands are within normal limits.
Kidneys demonstrate a normal enhancement pattern bilaterally. A
simple cyst is noted within the left kidney stable from the prior
exam. No renal calculi or obstructive changes are noted. The bladder
is decompressed. Mucosal enhancement is noted posteriorly in the
bladder although no discrete mass is noted. This may be accentuated
by the decompressed nature.

Stomach/Bowel: Scattered fecal material is noted throughout the
colon. No obstructive or inflammatory changes are seen. The appendix
is within normal limits. Small bowel and stomach are unremarkable.
Skin defect is noted from prior gastrostomy catheter.

Vascular/Lymphatic: Atherosclerotic calcifications of the abdominal
aorta are noted. No aneurysmal dilatation is seen. No significant
lymphadenopathy is noted.

Reproductive: Status post hysterectomy. No adnexal masses.

Other: No abdominal wall hernia or abnormality. No abdominopelvic
ascites.

Musculoskeletal: Degenerative changes of the lumbar spine are noted.
Chronic appearing compression deformities at L1 and L2 are noted. No
other focal abnormality is noted.
IMPRESSION: Treated lesion within the medial aspect of the right lobe of the
liver in the dome of the liver consistent with the given clinical
history. This is stable from previous MRI.

Cholelithiasis without complicating factors.

Mucosal enhancement within the posterior aspect of the bladder
likely related to the decompressed state. No discrete mass is noted.
